# Patient Record
Sex: Male | Born: 1955 | Race: White | Hispanic: No | Marital: Married | Smoking: Former smoker
Health system: Southern US, Community
[De-identification: ages and names within clinical notes are randomized; demographics above are authoritative.]

## PROBLEM LIST (undated history)

## (undated) DIAGNOSIS — N529 Male erectile dysfunction, unspecified: Secondary | ICD-10-CM

## (undated) DIAGNOSIS — B059 Measles without complication: Secondary | ICD-10-CM

## (undated) DIAGNOSIS — K219 Gastro-esophageal reflux disease without esophagitis: Secondary | ICD-10-CM

## (undated) DIAGNOSIS — F329 Major depressive disorder, single episode, unspecified: Principal | ICD-10-CM

## (undated) DIAGNOSIS — B019 Varicella without complication: Secondary | ICD-10-CM

## (undated) DIAGNOSIS — B0089 Other herpesviral infection: Secondary | ICD-10-CM

## (undated) DIAGNOSIS — F418 Other specified anxiety disorders: Secondary | ICD-10-CM

## (undated) DIAGNOSIS — C44300 Unspecified malignant neoplasm of skin of unspecified part of face: Secondary | ICD-10-CM

## (undated) DIAGNOSIS — F419 Anxiety disorder, unspecified: Principal | ICD-10-CM

## (undated) DIAGNOSIS — H9190 Unspecified hearing loss, unspecified ear: Secondary | ICD-10-CM

## (undated) DIAGNOSIS — B351 Tinea unguium: Secondary | ICD-10-CM

## (undated) DIAGNOSIS — R030 Elevated blood-pressure reading, without diagnosis of hypertension: Secondary | ICD-10-CM

## (undated) DIAGNOSIS — E039 Hypothyroidism, unspecified: Secondary | ICD-10-CM

## (undated) DIAGNOSIS — E669 Obesity, unspecified: Secondary | ICD-10-CM

## (undated) DIAGNOSIS — B269 Mumps without complication: Secondary | ICD-10-CM

## (undated) DIAGNOSIS — Z0000 Encounter for general adult medical examination without abnormal findings: Secondary | ICD-10-CM

## (undated) DIAGNOSIS — N200 Calculus of kidney: Secondary | ICD-10-CM

## (undated) HISTORY — DX: Elevated blood-pressure reading, without diagnosis of hypertension: R03.0

## (undated) HISTORY — DX: Varicella without complication: B01.9

## (undated) HISTORY — DX: Calculus of kidney: N20.0

## (undated) HISTORY — DX: Male erectile dysfunction, unspecified: N52.9

## (undated) HISTORY — DX: Measles without complication: B05.9

## (undated) HISTORY — DX: Encounter for general adult medical examination without abnormal findings: Z00.00

## (undated) HISTORY — DX: Major depressive disorder, single episode, unspecified: F32.9

## (undated) HISTORY — DX: Anxiety disorder, unspecified: F41.9

## (undated) HISTORY — DX: Unspecified hearing loss, unspecified ear: H91.90

## (undated) HISTORY — DX: Unspecified malignant neoplasm of skin of unspecified part of face: C44.300

## (undated) HISTORY — DX: Obesity, unspecified: E66.9

## (undated) HISTORY — DX: Other herpesviral infection: B00.89

## (undated) HISTORY — DX: Other specified anxiety disorders: F41.8

## (undated) HISTORY — DX: Gastro-esophageal reflux disease without esophagitis: K21.9

## (undated) HISTORY — DX: Hypothyroidism, unspecified: E03.9

## (undated) HISTORY — DX: Tinea unguium: B35.1

## (undated) HISTORY — DX: Mumps without complication: B26.9

---

## 2006-05-03 ENCOUNTER — Ambulatory Visit: Payer: Self-pay | Admitting: Pulmonary Disease

## 2007-08-15 ENCOUNTER — Ambulatory Visit: Payer: Self-pay | Admitting: Pulmonary Disease

## 2007-08-15 LAB — CONVERTED CEMR LAB
AST: 21 units/L (ref 0–37)
Albumin: 4 g/dL (ref 3.5–5.2)
Basophils Absolute: 0 10*3/uL (ref 0.0–0.1)
Calcium: 8.9 mg/dL (ref 8.4–10.5)
Chloride: 107 meq/L (ref 96–112)
Direct LDL: 146.9 mg/dL
Eosinophils Absolute: 0.1 10*3/uL (ref 0.0–0.6)
GFR calc Af Amer: 101 mL/min
GFR calc non Af Amer: 83 mL/min
HDL: 35.5 mg/dL — ABNORMAL LOW (ref >39.0)
MCHC: 34.4 g/dL (ref 30.0–36.0)
MCV: 93.6 fL (ref 78.0–100.0)
Neutrophils Relative %: 54.7 % (ref 43.0–77.0)
Platelets: 187 10*3/uL (ref 150–400)
RBC: 4.64 M/uL (ref 4.22–5.81)
Sodium: 138 meq/L (ref 135–145)
Triglycerides: 136 mg/dL (ref 0–149)

## 2009-02-03 DIAGNOSIS — J209 Acute bronchitis, unspecified: Secondary | ICD-10-CM | POA: Insufficient documentation

## 2009-02-03 DIAGNOSIS — T7840XA Allergy, unspecified, initial encounter: Secondary | ICD-10-CM | POA: Insufficient documentation

## 2009-02-03 DIAGNOSIS — Z87898 Personal history of other specified conditions: Secondary | ICD-10-CM | POA: Insufficient documentation

## 2009-02-03 DIAGNOSIS — R0789 Other chest pain: Secondary | ICD-10-CM | POA: Insufficient documentation

## 2009-02-03 DIAGNOSIS — E7800 Pure hypercholesterolemia, unspecified: Secondary | ICD-10-CM | POA: Insufficient documentation

## 2009-02-03 DIAGNOSIS — M1990 Unspecified osteoarthritis, unspecified site: Secondary | ICD-10-CM | POA: Insufficient documentation

## 2009-08-19 ENCOUNTER — Encounter: Payer: Self-pay | Admitting: Adult Health

## 2009-08-19 ENCOUNTER — Ambulatory Visit: Payer: Self-pay | Admitting: Pulmonary Disease

## 2009-08-22 DIAGNOSIS — K219 Gastro-esophageal reflux disease without esophagitis: Secondary | ICD-10-CM | POA: Insufficient documentation

## 2009-08-23 LAB — CONVERTED CEMR LAB
ALT: 32 units/L (ref 0–53)
AST: 30 units/L (ref 0–37)
BUN: 13 mg/dL (ref 6–23)
Basophils Relative: 1.8 % (ref 0.0–3.0)
Chloride: 110 meq/L (ref 96–112)
Eosinophils Relative: 1.3 % (ref 0.0–5.0)
HCT: 44.4 % (ref 39.0–52.0)
Hemoglobin: 15.4 g/dL (ref 13.0–17.0)
Lymphs Abs: 1.8 10*3/uL (ref 0.7–4.0)
MCV: 93.6 fL (ref 78.0–100.0)
Monocytes Absolute: 0.4 10*3/uL (ref 0.1–1.0)
Neutro Abs: 2.5 10*3/uL (ref 1.4–7.7)
Platelets: 182 10*3/uL (ref 150.0–400.0)
Potassium: 3.9 meq/L (ref 3.5–5.1)
RBC: 4.74 M/uL (ref 4.22–5.81)
Sodium: 143 meq/L (ref 135–145)
TSH: 2.19 microintl units/mL (ref 0.35–5.50)
Total Protein: 7.3 g/dL (ref 6.0–8.3)
WBC: 4.9 10*3/uL (ref 4.5–10.5)

## 2009-09-09 ENCOUNTER — Ambulatory Visit: Payer: Self-pay | Admitting: Pulmonary Disease

## 2010-12-10 HISTORY — PX: WISDOM TOOTH EXTRACTION: SHX21

## 2012-10-03 ENCOUNTER — Encounter: Payer: Self-pay | Admitting: Family Medicine

## 2012-10-03 ENCOUNTER — Ambulatory Visit (INDEPENDENT_AMBULATORY_CARE_PROVIDER_SITE_OTHER): Payer: Managed Care, Other (non HMO) | Admitting: Family Medicine

## 2012-10-03 VITALS — BP 139/84 | HR 77 | Temp 97.4°F | Ht 75.0 in | Wt 261.1 lb

## 2012-10-03 DIAGNOSIS — E785 Hyperlipidemia, unspecified: Secondary | ICD-10-CM

## 2012-10-03 DIAGNOSIS — R030 Elevated blood-pressure reading, without diagnosis of hypertension: Secondary | ICD-10-CM | POA: Insufficient documentation

## 2012-10-03 DIAGNOSIS — E669 Obesity, unspecified: Secondary | ICD-10-CM

## 2012-10-03 DIAGNOSIS — T7840XA Allergy, unspecified, initial encounter: Secondary | ICD-10-CM

## 2012-10-03 DIAGNOSIS — E7800 Pure hypercholesterolemia, unspecified: Secondary | ICD-10-CM

## 2012-10-03 DIAGNOSIS — Z23 Encounter for immunization: Secondary | ICD-10-CM

## 2012-10-03 DIAGNOSIS — IMO0001 Reserved for inherently not codable concepts without codable children: Secondary | ICD-10-CM

## 2012-10-03 DIAGNOSIS — H60399 Other infective otitis externa, unspecified ear: Secondary | ICD-10-CM

## 2012-10-03 DIAGNOSIS — N400 Enlarged prostate without lower urinary tract symptoms: Secondary | ICD-10-CM

## 2012-10-03 DIAGNOSIS — H6090 Unspecified otitis externa, unspecified ear: Secondary | ICD-10-CM

## 2012-10-03 DIAGNOSIS — Z87898 Personal history of other specified conditions: Secondary | ICD-10-CM

## 2012-10-03 HISTORY — DX: Reserved for inherently not codable concepts without codable children: IMO0001

## 2012-10-03 HISTORY — DX: Obesity, unspecified: E66.9

## 2012-10-03 LAB — LIPID PANEL
Cholesterol: 215 mg/dL — ABNORMAL HIGH (ref 0–200)
HDL: 35.6 mg/dL — ABNORMAL LOW (ref >39.00)
VLDL: 37.6 mg/dL (ref 0.0–40.0)

## 2012-10-03 LAB — RENAL FUNCTION PANEL
Albumin: 3.7 g/dL (ref 3.5–5.2)
BUN: 14 mg/dL (ref 6–23)
Creatinine, Ser: 1.1 mg/dL (ref 0.4–1.5)
GFR: 73.73 mL/min (ref >60.00)
Phosphorus: 2.5 mg/dL (ref 2.3–4.6)

## 2012-10-03 LAB — HEPATIC FUNCTION PANEL
AST: 25 U/L (ref 0–37)
Albumin: 3.7 g/dL (ref 3.5–5.2)
Total Protein: 6.7 g/dL (ref 6.0–8.3)

## 2012-10-03 LAB — CBC
Platelets: 163 10*3/uL (ref 150.0–400.0)
RBC: 4.71 Mil/uL (ref 4.22–5.81)

## 2012-10-03 MED ORDER — TETANUS-DIPHTH-ACELL PERTUSSIS 5-2.5-18.5 LF-MCG/0.5 IM SUSP: 0.5000 mL | Freq: Once | INTRAMUSCULAR | Status: DC

## 2012-10-03 MED ORDER — NEOMYCIN-POLYMYXIN-HC 3.5-10000-1 OT SOLN: 3.0000 [drp] | Freq: Three times a day (TID) | OTIC | Status: DC

## 2012-10-03 NOTE — Patient Instructions (Addendum)
Otitis Externa Otitis externa is a bacterial or fungal infection of the outer ear canal. This is the area from the eardrum to the outside of the ear. Otitis externa is sometimes called "swimmer's ear." CAUSES  Possible causes of infection include:  Swimming in dirty water.  Moisture remaining in the ear after swimming or bathing.  Mild injury (trauma) to the ear.  Objects stuck in the ear (foreign body).  Cuts or scrapes (abrasions) on the outside of the ear. SYMPTOMS  The first symptom of infection is often itching in the ear canal. Later signs and symptoms may include swelling and redness of the ear canal, ear pain, and yellowish-white fluid (pus) coming from the ear. The ear pain may be worse when pulling on the earlobe. DIAGNOSIS  Your caregiver will perform a physical exam. A sample of fluid may be taken from the ear and examined for bacteria or fungi. TREATMENT  Antibiotic ear drops are often given for 10 to 14 days. Treatment may also include pain medicine or corticosteroids to reduce itching and swelling. PREVENTION   Keep your ear dry. Use the corner of a towel to absorb water out of the ear canal after swimming or bathing.  Avoid scratching or putting objects inside your ear. This can damage the ear canal or remove the protective wax that lines the canal. This makes it easier for bacteria and fungi to grow.  Avoid swimming in lakes, polluted water, or poorly chlorinated pools.  You may use ear drops made of rubbing alcohol and vinegar after swimming. Combine equal parts of white vinegar and alcohol in a bottle. Put 3 or 4 drops into each ear after swimming. HOME CARE INSTRUCTIONS   Apply antibiotic ear drops to the ear canal as prescribed by your caregiver.  Only take over-the-counter or prescription medicines for pain, discomfort, or fever as directed by your caregiver.  If you have diabetes, follow any additional treatment instructions from your caregiver.  Keep all  follow-up appointments as directed by your caregiver. SEEK MEDICAL CARE IF:   You have a fever.  Your ear is still red, swollen, painful, or draining pus after 3 days.  Your redness, swelling, or pain gets worse.  You have a severe headache.  You have redness, swelling, pain, or tenderness in the area behind your ear. MAKE SURE YOU:   Understand these instructions.  Will watch your condition.  Will get help right away if you are not doing well or get worse. Document Released: 11/26/2005 Document Revised: 02/18/2012 Document Reviewed: 12/13/2011 [employer] Patient Information 2013 [employer]  

## 2012-10-05 NOTE — Assessment & Plan Note (Signed)
Previously seen by urology but not in a couple of years. Will check PSA, asymptomatic

## 2012-10-05 NOTE — Assessment & Plan Note (Signed)
Given handout for DASH diet and check tsh

## 2012-10-05 NOTE — Assessment & Plan Note (Signed)
Patient agrees to fasting labs prior to next visit. will discuss at check up

## 2012-10-05 NOTE — Assessment & Plan Note (Signed)
Cortisporin otic is prescribed and encouraged probiotics daily

## 2012-10-05 NOTE — Assessment & Plan Note (Signed)
Encouraged daily antihistamine

## 2012-10-05 NOTE — Progress Notes (Signed)
Patient ID: Gerald Booth, male   DOB: 1956/08/05, 56 y.o.   MRN: [id number] Gerald Booth [id number] 1956-06-04 10/05/2012      Progress Note New Patient  Subjective  Chief Complaint  Chief Complaint  Patient presents with  . Otalgia    pt is establishing next week with Dr Abner Greenspan- both ears feel clogged, itchy, and achy X 1 week    HPI  Patient is a 56 year old Caucasian male who is in today for urgent new patient appointment. He's complaining of itching and aching in his ears for about a week. He does wear earplugs at work. He denies fevers or chills. Denies discharge or headache. Denies tinnitus. No chest pain, palpitations, shortness of breath, GI or GU complaints at this time. This penicillins allergy from tablet reports he taken amoxicillin without difficulty in the past.  Past Medical History  Diagnosis Date  . Elevated BP 10/03/2012  . Obese 10/03/2012    History reviewed. No pertinent past surgical history.  History reviewed. No pertinent family history.  History   Social History  . Marital Status: Married    Spouse Name: N/A    Number of Children: N/A  . Years of Education: N/A   Occupational History  . Not on file.   Social History Main Topics  . Smoking status: Former Smoker    Types: Cigarettes    Start date: 12/11/1999  . Smokeless tobacco: Not on file   Comment: smoked on weekends  . Alcohol Use: Yes     1 a day  . Drug Use: No  . Sexually Active: Not on file   Other Topics Concern  . Not on file   Social History Narrative  . No narrative on file    No current outpatient prescriptions on file prior to visit.   No current facility-administered medications on file prior to visit.    Allergies  Allergen Reactions  . Penicillins     Review of Systems  Review of Systems  Constitutional: Negative for fever, chills and malaise/fatigue.  HENT: Positive for ear discharge. Negative for hearing loss, ear pain, nosebleeds, congestion  and tinnitus.   Eyes: Negative for discharge.  Respiratory: Negative for cough, sputum production, shortness of breath and wheezing.   Cardiovascular: Negative for chest pain, palpitations and leg swelling.  Gastrointestinal: Negative for heartburn, nausea, vomiting, abdominal pain, diarrhea, constipation and blood in stool.  Genitourinary: Negative for dysuria, urgency, frequency and hematuria.  Musculoskeletal: Negative for myalgias, back pain and falls.  Skin: Negative for rash.  Neurological: Negative for dizziness, tremors, sensory change, focal weakness, loss of consciousness, weakness and headaches.  Endo/Heme/Allergies: Negative for polydipsia. Does not bruise/bleed easily.  Psychiatric/Behavioral: Negative for depression and suicidal ideas. The patient is not nervous/anxious and does not have insomnia.     Objective  BP 139/84  Pulse 77  Temp 97.4 F (36.3 C) (Temporal)  Ht 6\' 3"  (1.905 m)  Wt 261 lb 1.9 oz (118.443 kg)  BMI 32.64 kg/m2  SpO2 93%  Physical Exam  Physical Exam  Constitutional: He is oriented to person, place, and time and well-developed, well-nourished, and in no distress. No distress.  HENT:  Head: Normocephalic and atraumatic.  Left Ear: External ear normal.       Right ear canal erythematous and swollen  Eyes: Conjunctivae normal are normal.  Neck: Neck supple. No thyromegaly present.  Cardiovascular: Normal rate, regular rhythm and normal heart sounds.   No murmur heard. Pulmonary/Chest: Effort normal and breath sounds  normal. No respiratory distress.  Abdominal: He exhibits no distension and no mass. There is no tenderness.  Musculoskeletal: He exhibits no edema.  Neurological: He is alert and oriented to person, place, and time.  Skin: Skin is warm.  Psychiatric: Memory, affect and judgment normal.       Assessment & Plan  ALLERGY Encouraged daily antihistamine  Otitis externa Cortisporin otic is prescribed and encouraged probiotics  daily  Obese Given handout for DASH diet and check tsh  HYPERCHOLESTEROLEMIA Patient agrees to fasting labs prior to next visit. will discuss at check up  BENIGN PROSTATIC HYPERTROPHY, HX OF Previously seen by urology but not in a couple of years. Will check PSA, asymptomatic

## 2012-10-06 LAB — TSH: TSH: 3.19 u[IU]/mL (ref 0.35–5.50)

## 2012-10-06 LAB — LDL CHOLESTEROL, DIRECT: Direct LDL: 157.9 mg/dL

## 2012-10-10 ENCOUNTER — Encounter: Payer: Self-pay | Admitting: Family Medicine

## 2012-10-10 ENCOUNTER — Ambulatory Visit (INDEPENDENT_AMBULATORY_CARE_PROVIDER_SITE_OTHER): Payer: Managed Care, Other (non HMO) | Admitting: Family Medicine

## 2012-10-10 VITALS — BP 123/76 | HR 85 | Temp 98.4°F | Ht 75.0 in | Wt 253.1 lb

## 2012-10-10 DIAGNOSIS — H9190 Unspecified hearing loss, unspecified ear: Secondary | ICD-10-CM

## 2012-10-10 DIAGNOSIS — B009 Herpesviral infection, unspecified: Secondary | ICD-10-CM

## 2012-10-10 DIAGNOSIS — B351 Tinea unguium: Secondary | ICD-10-CM

## 2012-10-10 DIAGNOSIS — Z0000 Encounter for general adult medical examination without abnormal findings: Secondary | ICD-10-CM

## 2012-10-10 DIAGNOSIS — IMO0001 Reserved for inherently not codable concepts without codable children: Secondary | ICD-10-CM

## 2012-10-10 DIAGNOSIS — R030 Elevated blood-pressure reading, without diagnosis of hypertension: Secondary | ICD-10-CM

## 2012-10-10 DIAGNOSIS — E7800 Pure hypercholesterolemia, unspecified: Secondary | ICD-10-CM

## 2012-10-10 DIAGNOSIS — N529 Male erectile dysfunction, unspecified: Secondary | ICD-10-CM

## 2012-10-10 DIAGNOSIS — N200 Calculus of kidney: Secondary | ICD-10-CM

## 2012-10-10 DIAGNOSIS — M1990 Unspecified osteoarthritis, unspecified site: Secondary | ICD-10-CM

## 2012-10-10 DIAGNOSIS — E669 Obesity, unspecified: Secondary | ICD-10-CM

## 2012-10-10 DIAGNOSIS — B0089 Other herpesviral infection: Secondary | ICD-10-CM

## 2012-10-10 DIAGNOSIS — H60399 Other infective otitis externa, unspecified ear: Secondary | ICD-10-CM

## 2012-10-10 DIAGNOSIS — H6090 Unspecified otitis externa, unspecified ear: Secondary | ICD-10-CM

## 2012-10-10 DIAGNOSIS — K219 Gastro-esophageal reflux disease without esophagitis: Secondary | ICD-10-CM

## 2012-10-10 HISTORY — DX: Male erectile dysfunction, unspecified: N52.9

## 2012-10-10 HISTORY — DX: Other herpesviral infection: B00.89

## 2012-10-10 HISTORY — DX: Calculus of kidney: N20.0

## 2012-10-10 HISTORY — DX: Unspecified hearing loss, unspecified ear: H91.90

## 2012-10-10 HISTORY — DX: Encounter for general adult medical examination without abnormal findings: Z00.00

## 2012-10-10 HISTORY — DX: Tinea unguium: B35.1

## 2012-10-10 LAB — H. PYLORI ANTIBODY, IGG: H Pylori IgG: NEGATIVE

## 2012-10-10 MED ORDER — TADALAFIL 5 MG PO TABS: 5.0000 mg | ORAL_TABLET | Freq: Every day | ORAL | Status: DC | PRN

## 2012-10-10 MED ORDER — RANITIDINE HCL 300 MG PO TABS: 300.0000 mg | ORAL_TABLET | Freq: Every day | ORAL | Status: DC

## 2012-10-10 MED ORDER — VALACYCLOVIR HCL 500 MG PO TABS: 500.0000 mg | ORAL_TABLET | Freq: Two times a day (BID) | ORAL | Status: DC

## 2012-10-10 MED ORDER — ASPIRIN EC 81 MG PO TBEC: 81.0000 mg | DELAYED_RELEASE_TABLET | Freq: Every day | ORAL | Status: DC

## 2012-10-10 NOTE — Assessment & Plan Note (Signed)
Has tried Lamisil in the past. Continues to recur. We'll start distilled white vinegar soaks each night and then apply Vick's vapor rub and notify us if symptoms are not improving.

## 2012-10-10 NOTE — Assessment & Plan Note (Signed)
Patient has done a great job of following the DASH diet and moving more and his BP is much better. Continue to monitor sodium and we will reassess in 3 months or as needed

## 2012-10-10 NOTE — Assessment & Plan Note (Signed)
Has been recurrent over several years. He has some left upper quadrant discomfort as well as some burning such early and sour taste in his mouth with fluid at times. Will check a Helicobacter pylori titer and start him on ranitidine 300 mg daily. He is encouraged to continue attempting weight loss and to avoid spicy and fatty foods especially close to bedtime.

## 2012-10-10 NOTE — Assessment & Plan Note (Signed)
Avoid transplants continue weight loss attempts, increase exercise and start a fatty acid supplement.

## 2012-10-10 NOTE — Assessment & Plan Note (Signed)
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

## 2012-10-10 NOTE — Assessment & Plan Note (Signed)
Is up to date on most care except he continues to decline colonoscopy despite a 10+ minute discussion about the benefits vs the cost. Agrees to proceed with IFOB testing. Encouraged regular exercise and DASH diet

## 2012-10-10 NOTE — Assessment & Plan Note (Signed)
Has the most trouble with pain and stiffness in his fingers left hand is worse than right hand as well as his knees. He is encouraged to add Krill oil supplement and to maintain his activity level. He is encouraged to try Aspercreme when necessary and report if symptoms worsen.

## 2012-10-10 NOTE — Assessment & Plan Note (Signed)
Has used Levitra in the past with good results but would like to try the Cialis 5 mg daily. He previously had discussed this with his urologist per his urologist has left the area. He is given a prescription for the Cialis today. He is warned regarding side effects. Call if any concerns

## 2012-10-10 NOTE — Assessment & Plan Note (Signed)
Symptomatically improved does still have significant cerumen in left ear it is flushed today.

## 2012-10-10 NOTE — Assessment & Plan Note (Signed)
Good weight loss since his last visit encouraged him to continue the DASH diet

## 2012-10-10 NOTE — Assessment & Plan Note (Signed)
Encouraged good hydration with water and add lemon

## 2012-10-10 NOTE — Progress Notes (Signed)
Patient ID: Gerald Booth, male   DOB: [birth year], 56 y.o.   MRN: [id number] [name] [id number] 1956-03-04 10/10/2012      Progress Note New Patient  Subjective  Chief Complaint  Chief Complaint  Patient presents with  . Establish Care    new patient    HPI  Patient is a 56 year old Caucasian male who is in today to finish establish care. He was seen last week with otitis externa and is feeling much better. No more pain although there was a waiting period today and she feels relatively well. Does acknowledge she's had trouble off and on over the years of heartburn and has been flared a bit lately. He has some mild left upper quadrant discomfort as well as some substernal burning at night and occasionally some sour fluid in the mouth. He complains of stiffness and pain across his knuckles in both hands left worse than right as well as some bilateral knee pain at times. Onychomycosis has been treated in the past with Lamisil and has returned. Has a distant history kidney stones but has had not had one in years. Uses Levitra for ED but would like to switch to daily Cialis. Broke numerous ribs on the left falling downstairs years ago but has recovered from that nicely. No chest pain, palpitations, shortness of breath, GI or GU complaints otherwise noted  Past Medical History  Diagnosis Date  . Elevated BP 10/03/2012  . Obese 10/03/2012  . Chicken pox as a child  . Measles as a child  . Mumps as a child  . Kidney stones 10/10/2012  . Hearing loss 10/10/2012  . Onychomycosis 10/10/2012  . ED (erectile dysfunction) 10/10/2012    Past Surgical History  Procedure Date  . Wisdom tooth extraction 2012    1 tooth    Family History  Problem Relation Age of Onset  . Cancer Mother     breast, bone  . Hyperlipidemia Father   . Other Father     prostate surgery  . Heart attack Maternal Grandmother   . Kidney disease Maternal Grandfather     kidney failure    History    Social History  . Marital Status: Married    Spouse Name: N/A    Number of Children: N/A  . Years of Education: N/A   Occupational History  . Not on file.   Social History Main Topics  . Smoking status: Former Smoker    Types: Cigarettes    Start date: 12/11/1991  . Smokeless tobacco: Never Used   Comment: smoked on weekends  . Alcohol Use: Yes     1 a day  . Drug Use: No  . Sexually Active: Yes   Other Topics Concern  . Not on file   Social History Narrative  . No narrative on file    Current Outpatient Prescriptions on File Prior to Visit  Medication Sig Dispense Refill  . ibuprofen (ADVIL,MOTRIN) 200 MG tablet Take 200 mg by mouth every 6 (six) hours as needed.      . neomycin-polymyxin-hydrocortisone (CORTISPORIN) otic solution Place 3 drops into both ears 3 (three) times daily.  10 mL  0  . ranitidine (ZANTAC) 300 MG tablet Take 1 tablet (300 mg total) by mouth at bedtime.  30 tablet  5  . tadalafil (CIALIS) 5 MG tablet Take 1 tablet (5 mg total) by mouth daily as needed for erectile dysfunction.  30 tablet  5    Allergies  Allergen Reactions  .  Penicillins     Review of Systems  Review of Systems  Constitutional: Negative for fever, chills and malaise/fatigue.  HENT: Negative for hearing loss, nosebleeds and congestion.   Eyes: Negative for discharge.  Respiratory: Negative for cough, sputum production, shortness of breath and wheezing.   Cardiovascular: Negative for chest pain, palpitations and leg swelling.  Gastrointestinal: Positive for heartburn. Negative for nausea, vomiting, abdominal pain, diarrhea, constipation and blood in stool.  Genitourinary: Negative for dysuria, urgency, frequency and hematuria.  Musculoskeletal: Positive for back pain and joint pain. Negative for myalgias and falls.  Skin: Positive for itching and rash.  Neurological: Negative for dizziness, tremors, sensory change, focal weakness, loss of consciousness, weakness and  headaches.  Endo/Heme/Allergies: Negative for polydipsia. Does not bruise/bleed easily.  Psychiatric/Behavioral: Negative for depression and suicidal ideas. The patient is not nervous/anxious and does not have insomnia.     Objective  BP 123/76  Pulse 85  Temp 98.4 F (36.9 C) (Temporal)  Ht 6\' 3"  (1.905 m)  Wt 253 lb 1.9 oz (114.814 kg)  BMI 31.64 kg/m2  SpO2 94%  Physical Exam  Physical Exam  Constitutional: He is oriented to person, place, and time and well-developed, well-nourished, and in no distress. No distress.  HENT:  Head: Normocephalic and atraumatic.  Eyes: Conjunctivae normal are normal.  Neck: Neck supple. No thyromegaly present.  Cardiovascular: Normal rate, regular rhythm and normal heart sounds.   No murmur heard. Pulmonary/Chest: Effort normal and breath sounds normal. No respiratory distress.  Abdominal: Soft. Bowel sounds are normal. He exhibits no distension and no mass. There is no tenderness. There is no rebound and no guarding.  Musculoskeletal: He exhibits no edema.  Neurological: He is alert and oriented to person, place, and time.  Skin: Skin is warm and dry. No rash noted. No erythema.  Psychiatric: Memory, affect and judgment normal.       Assessment & Plan  Elevated BP Patient has done a great job of following the DASH diet and moving more and his BP is much better. Continue to monitor sodium and we will reassess in 3 months or as needed  GERD Has been recurrent over several years. He has some left upper quadrant discomfort as well as some burning such early and sour taste in his mouth with fluid at times. Will check a Helicobacter pylori titer and start him on ranitidine 300 mg daily. He is encouraged to continue attempting weight loss and to avoid spicy and fatty foods especially close to bedtime.  DEGENERATIVE JOINT DISEASE Has the most trouble with pain and stiffness in his fingers left hand is worse than right hand as well as his knees.  He is encouraged to add Krill oil supplement and to maintain his activity level. He is encouraged to try Aspercreme when necessary and report if symptoms worsen.  Otitis externa Symptomatically improved does still have significant cerumen in left ear it is flushed today.  Onychomycosis Has tried Lamisil in the past. Continues to recur. We'll start distilled white vinegar soaks each night and then apply Vick's vapor rub and notify us if symptoms are not improving.  Obese Good weight loss since his last visit encouraged him to continue the DASH diet  HYPERCHOLESTEROLEMIA  Avoid transplants continue weight loss attempts, increase exercise and start a fatty acid supplement.  Kidney stones Encouraged good hydration with water and add lemon  ED (erectile dysfunction) Has used Levitra in the past with good results but would like to try the Cialis  5 mg daily. He previously had discussed this with his urologist per his urologist has left the area. He is given a prescription for the Cialis today. He is warned regarding side effects. Call if any concerns

## 2012-10-10 NOTE — Patient Instructions (Addendum)
Try soaking feet in warm water and distilled white vinegar for 15 minutes each night and then apply Vick's Vapor Rub   Ringworm, Nail A fungal infection of the nail (tinea unguium/onychomycosis) is common. It is common as the visible part of the nail is composed of dead cells which have no blood supply to help prevent infection. It occurs because fungi are everywhere and will pick any opportunity to grow on any dead material. Because nails are very slow growing they require up to 2 years of treatment with anti-fungal medications. The entire nail back to the base is infected. This includes approximately  of the nail which you cannot see. If your caregiver has prescribed a medication by mouth, take it every day and as directed. No progress will be seen for at least 6 to 9 months. Do not be disappointed! Because fungi live on dead cells with little or no exposure to blood supply, medication delivery to the infection is slow; thus the cure is slow. It is also why you can observe no progress in the first 6 months. The nail becoming cured is the base of the nail, as it has the blood supply. Topical medication such as creams and ointments are usually not effective. Important in successful treatment of nail fungus is closely following the medication regimen that your doctor prescribes. Sometimes you and your caregiver may elect to speed up this process by surgical removal of all the nails. Even this may still require 6 to 9 months of additional oral medications. See your caregiver as directed. Remember there will be no visible improvement for at least 6 months. See your caregiver sooner if other signs of infection (redness and swelling) develop. Document Released: 11/23/2000 Document Revised: 02/18/2012 Document Reviewed: 02/01/2009 [facility] Patient Information 2013 [place], [place].

## 2012-10-13 ENCOUNTER — Other Ambulatory Visit: Payer: Self-pay

## 2012-10-13 MED ORDER — CELECOXIB 200 MG PO CAPS: 200.0000 mg | ORAL_CAPSULE | Freq: Two times a day (BID) | ORAL | Status: DC

## 2012-10-13 NOTE — Telephone Encounter (Signed)
pts wife informed and rx sent to pharmacy.   Pts wife states it was bid prn

## 2012-10-13 NOTE — Progress Notes (Signed)
Quick Note:  Patients spouse informed and voiced understanding ______ 

## 2012-10-13 NOTE — Telephone Encounter (Signed)
Celebrex might have been mentioned in passing, may have it just make sure he remembers he cannot take it with the Ibuprofen. I need to know if he took it once or twice a day. Celebrex 200 mg po bid is a standard dose, with food. Can send in that if he agrees with #60 1 rf

## 2012-10-13 NOTE — Telephone Encounter (Signed)
pts spouse called stating that pt thought Celebrex was going to be called into pharmacy for his arthritis. Please advise?

## 2012-10-14 ENCOUNTER — Other Ambulatory Visit: Payer: Managed Care, Other (non HMO)

## 2012-10-14 LAB — FECAL OCCULT BLOOD, IMMUNOCHEMICAL: Fecal Occult Bld: NEGATIVE

## 2012-10-15 NOTE — Progress Notes (Signed)
Quick Note:  Patient Informed and voiced understanding ______ 

## 2013-01-16 ENCOUNTER — Ambulatory Visit: Payer: Managed Care, Other (non HMO) | Admitting: Family Medicine

## 2013-01-27 ENCOUNTER — Ambulatory Visit (INDEPENDENT_AMBULATORY_CARE_PROVIDER_SITE_OTHER): Payer: Managed Care, Other (non HMO) | Admitting: Family Medicine

## 2013-01-27 ENCOUNTER — Telehealth: Payer: Self-pay | Admitting: Family Medicine

## 2013-01-27 ENCOUNTER — Encounter: Payer: Self-pay | Admitting: Family Medicine

## 2013-01-27 VITALS — BP 130/80 | HR 74 | Temp 97.5°F | Ht 75.0 in | Wt 260.1 lb

## 2013-01-27 DIAGNOSIS — F419 Anxiety disorder, unspecified: Secondary | ICD-10-CM

## 2013-01-27 DIAGNOSIS — R030 Elevated blood-pressure reading, without diagnosis of hypertension: Secondary | ICD-10-CM

## 2013-01-27 DIAGNOSIS — F418 Other specified anxiety disorders: Secondary | ICD-10-CM

## 2013-01-27 DIAGNOSIS — IMO0001 Reserved for inherently not codable concepts without codable children: Secondary | ICD-10-CM

## 2013-01-27 DIAGNOSIS — F32A Depression, unspecified: Secondary | ICD-10-CM | POA: Insufficient documentation

## 2013-01-27 DIAGNOSIS — F341 Dysthymic disorder: Secondary | ICD-10-CM

## 2013-01-27 HISTORY — DX: Anxiety disorder, unspecified: F41.9

## 2013-01-27 HISTORY — DX: Other specified anxiety disorders: F41.8

## 2013-01-27 HISTORY — DX: Depression, unspecified: F32.A

## 2013-01-27 MED ORDER — ESCITALOPRAM OXALATE 10 MG PO TABS
10.0000 mg | ORAL_TABLET | Freq: Every day | ORAL | Status: DC
Start: 2013-01-27 — End: 2013-04-10

## 2013-01-27 MED ORDER — ALPRAZOLAM 0.25 MG PO TABS: 0.2500 mg | ORAL_TABLET | Freq: Two times a day (BID) | ORAL | Status: DC | PRN

## 2013-01-27 NOTE — Telephone Encounter (Signed)
pts spouse states she will check with pt when he gets home

## 2013-01-27 NOTE — Assessment & Plan Note (Signed)
Slowly worsening over several months, acutely worse over 2 weeks since he found out his son will likely be moving to [place]. He will start Escitalopram 10 mg daily and given Alprazolam to use prn for anxiety and insomnia

## 2013-01-27 NOTE — Telephone Encounter (Signed)
Did you give this RX to patient? This is something that can't be electronically sent

## 2013-01-27 NOTE — Telephone Encounter (Signed)
I am 99% I did give it to him, it is likely mixed up in his check out papers, I remember handing it to him

## 2013-01-27 NOTE — Progress Notes (Signed)
Patient ID: Gerald Booth, male   DOB: 07-29-1956, 57 y.o.   MRN: [id number] Gerald Booth [id number] [birth year] 01/27/2013      Progress Note-Follow Up  Subjective  Chief Complaint  Chief Complaint  Patient presents with  . Insomnia    HPI  57 year old Caucasian male who is in today with worsening anxiety and depression. He reports his mood is been lower over several months. Roughly 2 recently finished a satisfactory moving to [place] and he notes she's been excessively irritable and easily upset since then. He reports he cried cc. Has trouble concentrating. Is having trouble sleeping. Is even describing periods of panic attack with difficulty breathing palpitations and a sense of crawling out of his skin. No other acute illness or triggers. He reports everything else at home is going well but worked continues to be a stressor.  Past Medical History  Diagnosis Date  . Elevated BP 10/03/2012  . Obese 10/03/2012  . Chicken pox as a child  . Measles as a child  . Mumps as a child  . Kidney stones 10/10/2012  . Hearing loss 10/10/2012  . Onychomycosis 10/10/2012  . ED (erectile dysfunction) 10/10/2012  . 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 Preventative health care 10/10/2012  . Anxiety and depression 01/27/2013  . Depression with anxiety 01/27/2013    Past Surgical History  Procedure Laterality Date  . Wisdom tooth extraction  2012    1 tooth    Family History  Problem Relation Age of Onset  . Cancer Mother     breast, bone  . Hyperlipidemia Father   . Other Father     prostate surgery  . Heart attack Maternal Grandmother   . Kidney disease Maternal Grandfather     kidney failure    History   Social History  . Marital Status: Married    Spouse Name: N/A    Number of Children: N/A  . Years of Education: N/A   Occupational History  . Not on file.   Social History Main Topics  . Smoking status: Former Smoker    Types: Cigarettes    Start date: 12/11/1991  .  Smokeless tobacco: Never Used     Comment: smoked on weekends  . Alcohol Use: Yes     Comment: 1 a day  . Drug Use: No  . Sexually Active: Yes   Other Topics Concern  . Not on file   Social History Narrative  . No narrative on file    Current Outpatient Prescriptions on File Prior to Visit  Medication Sig Dispense Refill  . aspirin EC 81 MG tablet Take 1 tablet (81 mg total) by mouth daily.      . celecoxib (CELEBREX) 200 MG capsule Take 1 capsule (200 mg total) by mouth 2 (two) times daily.  60 capsule  1  . ibuprofen (ADVIL,MOTRIN) 200 MG tablet Take 200 mg by mouth every 6 (six) hours as needed.      . neomycin-polymyxin-hydrocortisone (CORTISPORIN) otic solution Place 3 drops into both ears 3 (three) times daily.  10 mL  0  . ranitidine (ZANTAC) 300 MG tablet Take 1 tablet (300 mg total) by mouth at bedtime.  30 tablet  5  . tadalafil (CIALIS) 5 MG tablet Take 1 tablet (5 mg total) by mouth daily as needed for erectile dysfunction.  30 tablet  5  . valACYclovir (VALTREX) 500 MG tablet Take 1 tablet (500 mg total) by mouth 2 (two) times daily.  30 tablet  2  No current facility-administered medications on file prior to visit.    Allergies  Allergen Reactions  . Penicillins     Review of Systems  Review of Systems  Constitutional: Negative for fever and malaise/fatigue.  HENT: Negative for congestion.   Eyes: Negative for discharge.  Respiratory: Positive for shortness of breath.   Cardiovascular: Positive for palpitations. Negative for chest pain and leg swelling.  Gastrointestinal: Negative for nausea, abdominal pain and diarrhea.  Genitourinary: Negative for dysuria.  Musculoskeletal: Negative for falls.  Skin: Negative for rash.  Neurological: Negative for loss of consciousness and headaches.  Endo/Heme/Allergies: Negative for polydipsia.  Psychiatric/Behavioral: Positive for depression. Negative for suicidal ideas. The patient is nervous/anxious and has insomnia.      Objective  BP 130/80  Pulse 74  Temp(Src) 97.5 F (36.4 C) (Oral)  Ht 6\' 3"  (1.905 m)  Wt 260 lb 1.9 oz (117.99 kg)  BMI 32.51 kg/m2  SpO2 95%  Physical Exam  Physical Exam  Constitutional: He is oriented to person, place, and time and well-developed, well-nourished, and in no distress. No distress.  HENT:  Head: Normocephalic and atraumatic.  Eyes: Conjunctivae are normal.  Neck: Neck supple. No thyromegaly present.  Cardiovascular: Normal rate, regular rhythm and normal heart sounds.   No murmur heard. Pulmonary/Chest: Effort normal and breath sounds normal. No respiratory distress.  Abdominal: He exhibits no distension and no mass. There is no tenderness.  Musculoskeletal: He exhibits no edema.  Neurological: He is alert and oriented to person, place, and time.  Skin: Skin is warm.  Psychiatric: Memory, affect and judgment normal.    Lab Results  Component Value Date   TSH 3.19 10/03/2012   Lab Results  Component Value Date   WBC 4.8 10/03/2012   HGB 14.9 10/03/2012   HCT 44.7 10/03/2012   MCV 95.1 10/03/2012   PLT 163.0 10/03/2012   Lab Results  Component Value Date   CREATININE 1.1 10/03/2012   BUN 14 10/03/2012   NA 139 10/03/2012   K 4.1 10/03/2012   CL 105 10/03/2012   CO2 28 10/03/2012   Lab Results  Component Value Date   ALT 30 10/03/2012   AST 25 10/03/2012   ALKPHOS 61 10/03/2012   BILITOT 1.1 10/03/2012   Lab Results  Component Value Date   CHOL 215* 10/03/2012   Lab Results  Component Value Date   HDL 35.60* 10/03/2012   No results found for this basename: LDLCALC   Lab Results  Component Value Date   TRIG 188.0* 10/03/2012   Lab Results  Component Value Date   CHOLHDL 6 10/03/2012     Assessment & Plan  Elevated BP Well controlled, no changes to meds today  Depression with anxiety Slowly worsening over several months, acutely worse over 2 weeks since he found out his son will likely be moving to [place]. He will  start Escitalopram 10 mg daily and given Alprazolam to use prn for anxiety and insomnia

## 2013-01-27 NOTE — Patient Instructions (Addendum)

## 2013-01-27 NOTE — Telephone Encounter (Signed)
Patients wife called in stating that she called the pharmacy and the pharmacy told her that they never received alprazolam RX. Please resend

## 2013-01-27 NOTE — Assessment & Plan Note (Signed)
Well controlled, no changes to meds today

## 2013-03-09 ENCOUNTER — Telehealth: Payer: Self-pay | Admitting: Family Medicine

## 2013-03-09 NOTE — Telephone Encounter (Signed)
Yes he can take a 1/2 tab and take it at bedtime

## 2013-03-09 NOTE — Telephone Encounter (Signed)
Forwarded to provider for advisement/SLS

## 2013-03-09 NOTE — Telephone Encounter (Signed)
Patient is feeling very tired when taking the Lexapro. Can he take half the dosage to see if that helps? Okay to [facility] 03/10/13.

## 2013-03-10 NOTE — Telephone Encounter (Signed)
Patient unavailable; informed wife, Ardine Bjork HIPAA], of provider instructions; understood & agreed/SLS

## 2013-04-09 ENCOUNTER — Ambulatory Visit: Payer: Managed Care, Other (non HMO) | Admitting: Family Medicine

## 2013-04-10 ENCOUNTER — Ambulatory Visit (INDEPENDENT_AMBULATORY_CARE_PROVIDER_SITE_OTHER): Payer: Managed Care, Other (non HMO) | Admitting: Family Medicine

## 2013-04-10 ENCOUNTER — Encounter: Payer: Self-pay | Admitting: Family Medicine

## 2013-04-10 VITALS — BP 124/78 | HR 76 | Temp 97.9°F | Ht 75.0 in | Wt 258.0 lb

## 2013-04-10 DIAGNOSIS — E7800 Pure hypercholesterolemia, unspecified: Secondary | ICD-10-CM

## 2013-04-10 DIAGNOSIS — M1990 Unspecified osteoarthritis, unspecified site: Secondary | ICD-10-CM

## 2013-04-10 DIAGNOSIS — F341 Dysthymic disorder: Secondary | ICD-10-CM

## 2013-04-10 DIAGNOSIS — K219 Gastro-esophageal reflux disease without esophagitis: Secondary | ICD-10-CM

## 2013-04-10 DIAGNOSIS — F32A Depression, unspecified: Secondary | ICD-10-CM

## 2013-04-10 DIAGNOSIS — N529 Male erectile dysfunction, unspecified: Secondary | ICD-10-CM

## 2013-04-10 DIAGNOSIS — F419 Anxiety disorder, unspecified: Secondary | ICD-10-CM

## 2013-04-10 DIAGNOSIS — F411 Generalized anxiety disorder: Secondary | ICD-10-CM

## 2013-04-10 DIAGNOSIS — E669 Obesity, unspecified: Secondary | ICD-10-CM

## 2013-04-10 MED ORDER — ALPRAZOLAM 0.25 MG PO TABS: 0.2500 mg | ORAL_TABLET | Freq: Two times a day (BID) | ORAL | Status: DC | PRN

## 2013-04-10 MED ORDER — ESCITALOPRAM OXALATE 5 MG PO TABS: 5.0000 mg | ORAL_TABLET | Freq: Every day | ORAL | Status: DC

## 2013-04-10 NOTE — Patient Instructions (Addendum)
Consider Krill oil/MegaRed caps daily goes on sale 2 for 1 at [employer] with audiology results Next visit annual with labs prior lipid, renal, hepatic, cbc, hgba1c Cholesterol Cholesterol is a white, waxy, fat-like protein needed by your body in small amounts. The liver makes all the cholesterol you need. It is carried from the liver by the blood through the blood vessels. Deposits (plaque) may build up on blood vessel walls. This makes the arteries narrower and stiffer. Plaque increases the risk for heart attack and stroke. You cannot feel your cholesterol level even if it is very high. The only way to know is by a blood test to check your lipid (fats) levels. Once you know your cholesterol levels, you should keep a record of the test results. Work with your caregiver to to keep your levels in the desired range. WHAT THE RESULTS MEAN:  Total cholesterol is a rough measure of all the cholesterol in your blood.  LDL is the so-called bad cholesterol. This is the type that deposits cholesterol in the walls of the arteries. You want this level to be low.  HDL is the good cholesterol because it cleans the arteries and carries the LDL away. You want this level to be high.  Triglycerides are fat that the body can either burn for energy or store. High levels are closely linked to heart disease. DESIRED LEVELS:  Total cholesterol below 200.  LDL below 100 for people at risk, below 70 for very high risk.  HDL above 50 is good, above 60 is best.  Triglycerides below 150. HOW TO LOWER YOUR CHOLESTEROL:  Diet.  Choose fish or white meat chicken and [place], roasted or baked. Limit fatty cuts of red meat, fried foods, and processed meats, such as sausage and lunch meat.  Eat lots of fresh fruits and vegetables. Choose whole grains, beans, pasta, potatoes and cereals.  Use only small amounts of olive, corn or canola oils. Avoid butter, mayonnaise, shortening or palm kernel oils.  Avoid foods with trans-fats.  Use skim/nonfat milk and low-fat/nonfat yogurt and cheeses. Avoid whole milk, cream, ice cream, egg yolks and cheeses. Healthy desserts include angel food cake, ginger snaps, animal crackers, hard candy, popsicles, and low-fat/nonfat frozen yogurt. Avoid pastries, cakes, pies and cookies.  Exercise.  A regular program helps decrease LDL and raises HDL.  Helps with weight control.  Do things that increase your activity level like gardening, walking, or taking the stairs.  Medication.  May be prescribed by your caregiver to help lowering cholesterol and the risk for heart disease.  You may need medicine even if your levels are normal if you have several risk factors. HOME CARE INSTRUCTIONS   Follow your diet and exercise programs as suggested by your caregiver.  Take medications as directed.  Have blood work done when your caregiver feels it is necessary. MAKE SURE YOU:   Understand these instructions.  Will watch your condition.  Will get help right away if you are not doing well or get worse. Document Released: 08/21/2001 Document Revised: 02/18/2012 Document Reviewed: 02/11/2008 [facility] Patient Information 2013 [place], [place].

## 2013-04-12 NOTE — Progress Notes (Signed)
Patient ID: Gerald Booth, male   DOB: 08/17/1956, 57 y.o.   MRN: [id number] Gerald Booth [id number] 06-09-1956 04/12/2013      Progress Note-Follow Up  Subjective  Chief Complaint  Chief Complaint  Patient presents with  . Follow-up    HPI  Patient is a 57 year old Caucasian male who is in today for followup. Overall doing well. It's generally good relief from Celebrex to once daily occasionally has to take a second dose on a bad day. Denies any recent illness. No fevers or chills. No chest pain, headaches, palpitations, shortness of breath, GI or GU concerns. Uses Zantac intermittently for reflux but no acute complaints at this visit.  Past Medical History  Diagnosis Date  . Elevated BP 10/03/2012  . Obese 10/03/2012  . Chicken pox as a child  . Measles as a child  . Mumps as a child  . Kidney stones 10/10/2012  . Hearing loss 10/10/2012  . Onychomycosis 10/10/2012  . ED (erectile dysfunction) 10/10/2012  . 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 Preventative health care 10/10/2012  . Anxiety and depression 01/27/2013  . Depression with anxiety 01/27/2013    Past Surgical History  Procedure Laterality Date  . Wisdom tooth extraction  2012    1 tooth    Family History  Problem Relation Age of Onset  . Cancer Mother     breast, bone  . Hyperlipidemia Father   . Other Father     prostate surgery  . Heart attack Maternal Grandmother   . Kidney disease Maternal Grandfather     kidney failure    History   Social History  . Marital Status: Married    Spouse Name: N/A    Number of Children: N/A  . Years of Education: N/A   Occupational History  . Not on file.   Social History Main Topics  . Smoking status: Former Smoker    Types: Cigarettes    Start date: 12/11/1991  . Smokeless tobacco: Never Used     Comment: smoked on weekends  . Alcohol Use: Yes     Comment: 1 a day  . Drug Use: No  . Sexually Active: Yes   Other Topics Concern  . Not on file    Social History Narrative  . No narrative on file    Current Outpatient Prescriptions on File Prior to Visit  Medication Sig Dispense Refill  . aspirin EC 81 MG tablet Take 1 tablet (81 mg total) by mouth daily.      . celecoxib (CELEBREX) 200 MG capsule Take 1 capsule (200 mg total) by mouth 2 (two) times daily.  60 capsule  1  . neomycin-polymyxin-hydrocortisone (CORTISPORIN) otic solution Place 3 drops into both ears 3 (three) times daily.  10 mL  0  . ranitidine (ZANTAC) 300 MG tablet Take 1 tablet (300 mg total) by mouth at bedtime.  30 tablet  5  . tadalafil (CIALIS) 5 MG tablet Take 1 tablet (5 mg total) by mouth daily as needed for erectile dysfunction.  30 tablet  5  . valACYclovir (VALTREX) 500 MG tablet Take 1 tablet (500 mg total) by mouth 2 (two) times daily.  30 tablet  2   No current facility-administered medications on file prior to visit.    Allergies  Allergen Reactions  . Penicillins     Review of Systems  Review of Systems  Constitutional: Negative for fever and malaise/fatigue.  HENT: Negative for congestion.   Eyes: Negative for discharge.  Respiratory:  Negative for shortness of breath.   Cardiovascular: Negative for chest pain, palpitations and leg swelling.  Gastrointestinal: Negative for nausea, abdominal pain and diarrhea.  Genitourinary: Negative for dysuria.  Musculoskeletal: Positive for back pain and joint pain. Negative for falls.  Skin: Negative for rash.  Neurological: Negative for loss of consciousness and headaches.  Endo/Heme/Allergies: Negative for polydipsia.  Psychiatric/Behavioral: Negative for depression and suicidal ideas. The patient is not nervous/anxious and does not have insomnia.     Objective  BP 124/78  Pulse 76  Temp(Src) 97.9 F (36.6 C) (Oral)  Ht 6\' 3"  (1.905 m)  Wt 258 lb 0.6 oz (117.046 kg)  BMI 32.25 kg/m2  SpO2 94%  Physical Exam  Physical Exam  Constitutional: He is oriented to person, place, and time and  well-developed, well-nourished, and in no distress. No distress.  HENT:  Head: Normocephalic and atraumatic.  Eyes: Conjunctivae are normal.  Neck: Neck supple. No thyromegaly present.  Cardiovascular: Normal rate, regular rhythm and normal heart sounds.   No murmur heard. Pulmonary/Chest: Effort normal and breath sounds normal. No respiratory distress.  Abdominal: He exhibits no distension and no mass. There is no tenderness.  Musculoskeletal: He exhibits no edema.  Neurological: He is alert and oriented to person, place, and time.  Skin: Skin is warm.  Psychiatric: Memory, affect and judgment normal.    Lab Results  Component Value Date   TSH 3.19 10/03/2012   Lab Results  Component Value Date   WBC 4.8 10/03/2012   HGB 14.9 10/03/2012   HCT 44.7 10/03/2012   MCV 95.1 10/03/2012   PLT 163.0 10/03/2012   Lab Results  Component Value Date   CREATININE 1.1 10/03/2012   BUN 14 10/03/2012   NA 139 10/03/2012   K 4.1 10/03/2012   CL 105 10/03/2012   CO2 28 10/03/2012   Lab Results  Component Value Date   ALT 30 10/03/2012   AST 25 10/03/2012   ALKPHOS 61 10/03/2012   BILITOT 1.1 10/03/2012   Lab Results  Component Value Date   CHOL 215* 10/03/2012   Lab Results  Component Value Date   HDL 35.60* 10/03/2012   No results found for this basename: [facility]   Lab Results  Component Value Date   TRIG 188.0* 10/03/2012   Lab Results  Component Value Date   CHOLHDL 6 10/03/2012     Assessment & Plan  HYPERCHOLESTEROLEMIA Mild, avoid trans fats, minimize saturated fats and simple carbs. Increase exercise and consider Krill oil capsule daily.  Obese Encouraged DASH diet and increase exercise  Anxiety and depression Well-managed on Lexapro and occasional alprazolam.  ED (erectile dysfunction) Cialis 5 mg  GERD No complaints today. Good response to ranitidine as needed.  DEGENERATIVE JOINT DISEASE Uses Celebrex with good effect once to twice daily.  Should not use other NSAIDs with this medication but instead use Tylenol for breakthrough pain.

## 2013-04-12 NOTE — Assessment & Plan Note (Signed)
Cialis 5 mg 

## 2013-04-12 NOTE — Assessment & Plan Note (Signed)
Encouraged DASH diet and increase exercise

## 2013-04-12 NOTE — Assessment & Plan Note (Signed)
Mild, avoid trans fats, minimize saturated fats and simple carbs. Increase exercise and consider Krill oil capsule daily.

## 2013-04-12 NOTE — Assessment & Plan Note (Signed)
No complaints today. Good response to ranitidine as needed.

## 2013-04-12 NOTE — Assessment & Plan Note (Signed)
Well-managed on Lexapro and occasional alprazolam.

## 2013-04-12 NOTE — Assessment & Plan Note (Addendum)
Uses Celebrex with good effect once to twice daily. Should not use other NSAIDs with this medication but instead use Tylenol for breakthrough pain.

## 2013-09-25 ENCOUNTER — Telehealth: Payer: Self-pay | Admitting: *Deleted

## 2013-09-25 ENCOUNTER — Ambulatory Visit (INDEPENDENT_AMBULATORY_CARE_PROVIDER_SITE_OTHER): Payer: Managed Care, Other (non HMO)

## 2013-09-25 DIAGNOSIS — F32A Depression, unspecified: Secondary | ICD-10-CM

## 2013-09-25 DIAGNOSIS — F329 Major depressive disorder, single episode, unspecified: Secondary | ICD-10-CM

## 2013-09-25 DIAGNOSIS — Z23 Encounter for immunization: Secondary | ICD-10-CM

## 2013-09-25 MED ORDER — ALPRAZOLAM 0.25 MG PO TABS: 0.2500 mg | ORAL_TABLET | Freq: Two times a day (BID) | ORAL | Status: DC | PRN

## 2013-09-25 NOTE — Telephone Encounter (Signed)
Rx called to pharmacy voicemail. 

## 2013-09-25 NOTE — Telephone Encounter (Signed)
eScribe request for refill on Alprazolam Last filled - 05.02.14, #30x1 Last AEX - 05.02.14 Next AEX - 6 Months  Please Advise/SLS

## 2013-09-25 NOTE — Telephone Encounter (Signed)
OK to give refill on Alprazolam same strength, same sig, #30 with 1 rf

## 2013-09-28 ENCOUNTER — Other Ambulatory Visit: Payer: Self-pay | Admitting: Family Medicine

## 2013-09-28 NOTE — Telephone Encounter (Signed)
RX faxed and left a message for patient to return my call

## 2013-09-28 NOTE — Telephone Encounter (Signed)
Rx last filled 09/25/13 #30x 1 rf.  Pt last seen 04/10/13. Pls advise.

## 2013-09-28 NOTE — Telephone Encounter (Signed)
Approved refill needs appt for further refills, due to controlled substance, recurring prescriptions, needs to be seen at least every 6 months

## 2013-12-04 ENCOUNTER — Telehealth: Payer: Self-pay | Admitting: Family Medicine

## 2013-12-04 ENCOUNTER — Ambulatory Visit (INDEPENDENT_AMBULATORY_CARE_PROVIDER_SITE_OTHER): Payer: Managed Care, Other (non HMO) | Admitting: Family Medicine

## 2013-12-04 ENCOUNTER — Encounter: Payer: Self-pay | Admitting: Family Medicine

## 2013-12-04 VITALS — BP 120/90 | HR 73 | Temp 97.6°F | Ht 75.0 in | Wt 257.0 lb

## 2013-12-04 DIAGNOSIS — Z0000 Encounter for general adult medical examination without abnormal findings: Secondary | ICD-10-CM

## 2013-12-04 DIAGNOSIS — Z23 Encounter for immunization: Secondary | ICD-10-CM

## 2013-12-04 DIAGNOSIS — IMO0001 Reserved for inherently not codable concepts without codable children: Secondary | ICD-10-CM

## 2013-12-04 DIAGNOSIS — E7800 Pure hypercholesterolemia, unspecified: Secondary | ICD-10-CM

## 2013-12-04 DIAGNOSIS — B009 Herpesviral infection, unspecified: Secondary | ICD-10-CM

## 2013-12-04 DIAGNOSIS — F341 Dysthymic disorder: Secondary | ICD-10-CM

## 2013-12-04 DIAGNOSIS — E785 Hyperlipidemia, unspecified: Secondary | ICD-10-CM

## 2013-12-04 DIAGNOSIS — E669 Obesity, unspecified: Secondary | ICD-10-CM

## 2013-12-04 DIAGNOSIS — R030 Elevated blood-pressure reading, without diagnosis of hypertension: Secondary | ICD-10-CM

## 2013-12-04 DIAGNOSIS — N529 Male erectile dysfunction, unspecified: Secondary | ICD-10-CM

## 2013-12-04 DIAGNOSIS — R7309 Other abnormal glucose: Secondary | ICD-10-CM

## 2013-12-04 DIAGNOSIS — F32A Depression, unspecified: Secondary | ICD-10-CM

## 2013-12-04 DIAGNOSIS — F329 Major depressive disorder, single episode, unspecified: Secondary | ICD-10-CM

## 2013-12-04 DIAGNOSIS — R739 Hyperglycemia, unspecified: Secondary | ICD-10-CM

## 2013-12-04 LAB — HEMOGLOBIN A1C
Hgb A1c MFr Bld: 5.4 % (ref <5.7)
Mean Plasma Glucose: 108 mg/dL (ref <117)

## 2013-12-04 LAB — CBC
HCT: 44.5 % (ref 39.0–52.0)
MCH: 32.1 pg (ref 26.0–34.0)
MCHC: 36.2 g/dL — ABNORMAL HIGH (ref 30.0–36.0)
Platelets: 164 10*3/uL (ref 150–400)
RDW: 13.1 % (ref 11.5–15.5)

## 2013-12-04 LAB — RENAL FUNCTION PANEL
Albumin: 4.5 g/dL (ref 3.5–5.2)
Calcium: 9.4 mg/dL (ref 8.4–10.5)
Sodium: 138 mEq/L (ref 135–145)

## 2013-12-04 LAB — HEPATIC FUNCTION PANEL
AST: 22 U/L (ref 0–37)
Albumin: 4.5 g/dL (ref 3.5–5.2)
Alkaline Phosphatase: 72 U/L (ref 39–117)
Indirect Bilirubin: 0.8 mg/dL (ref 0.0–0.9)
Total Protein: 7 g/dL (ref 6.0–8.3)

## 2013-12-04 LAB — LIPID PANEL
HDL: 43 mg/dL (ref >39)
LDL Cholesterol: 143 mg/dL — ABNORMAL HIGH (ref 0–99)
Total CHOL/HDL Ratio: 5.6 Ratio
VLDL: 54 mg/dL — ABNORMAL HIGH (ref 0–40)

## 2013-12-04 MED ORDER — ACYCLOVIR 800 MG PO TABS: 800.0000 mg | ORAL_TABLET | Freq: Three times a day (TID) | ORAL | Status: DC | PRN

## 2013-12-04 MED ORDER — TADALAFIL 5 MG PO TABS
5.0000 mg | ORAL_TABLET | Freq: Every day | ORAL | Status: DC | PRN
Start: 2013-12-04 — End: 2014-06-18

## 2013-12-04 MED ORDER — ALPRAZOLAM 0.25 MG PO TABS: 0.2500 mg | ORAL_TABLET | Freq: Every day | ORAL | Status: DC | PRN

## 2013-12-04 NOTE — Patient Instructions (Signed)

## 2013-12-04 NOTE — Telephone Encounter (Signed)
LAB ORDER WEEK OF 05-25-2014 Annual exam labs prior to visit lipid, renal, cbc, tsh, hepatic, hgba1c

## 2013-12-04 NOTE — Progress Notes (Signed)
Pre visit review using our clinic review tool, if applicable. No additional management support is needed unless otherwise documented below in the visit note. 

## 2013-12-06 ENCOUNTER — Encounter: Payer: Self-pay | Admitting: Family Medicine

## 2013-12-06 NOTE — Assessment & Plan Note (Signed)
Well controlled, no changes, encouraged DASH diet

## 2013-12-06 NOTE — Progress Notes (Signed)
Patient ID: Gerald Booth, male   DOB: 12/11/1955, 56 y.o.   MRN: [id number] Lamarion Mcevers [id number] [birth year] 12/06/2013      Progress Note-Follow Up  Subjective  Chief Complaint  Chief Complaint  Patient presents with  . Follow-up  . Injections    prevnar    HPI  Patient is a 57 year old male who is in today for followup. Generally been feeling well. No recent illness. Denies chest pain, palpitations or shortness of breath. No GI or GU concerns. Taking medications as prescribed. Given Prevnar today  Past Medical History  Diagnosis Date  . Elevated BP 10/03/2012  . Obese 10/03/2012  . Chicken pox as a child  . Measles as a child  . Mumps as a child  . Kidney stones 10/10/2012  . Hearing loss 10/10/2012  . Onychomycosis 10/10/2012  . ED (erectile dysfunction) 10/10/2012  . 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 Preventative health care 10/10/2012  . Anxiety and depression 01/27/2013  . Depression with anxiety 01/27/2013    Past Surgical History  Procedure Laterality Date  . Wisdom tooth extraction  2012    1 tooth    Family History  Problem Relation Age of Onset  . Cancer Mother     breast, bone  . Hyperlipidemia Father   . Other Father     prostate surgery  . Heart attack Maternal Grandmother   . Kidney disease Maternal Grandfather     kidney failure    History   Social History  . Marital Status: Married    Spouse Name: N/A    Number of Children: N/A  . Years of Education: N/A   Occupational History  . Not on file.   Social History Main Topics  . Smoking status: Former Smoker    Types: Cigarettes    Start date: 12/11/1991  . Smokeless tobacco: Never Used     Comment: smoked on weekends  . Alcohol Use: Yes     Comment: 1 a day  . Drug Use: No  . Sexual Activity: Yes   Other Topics Concern  . Not on file   Social History Narrative  . No narrative on file    Current Outpatient Prescriptions on File Prior to Visit  Medication Sig Dispense  Refill  . aspirin EC 81 MG tablet Take 1 tablet (81 mg total) by mouth daily.      . celecoxib (CELEBREX) 200 MG capsule Take 1 capsule (200 mg total) by mouth 2 (two) times daily.  60 capsule  1  . escitalopram (LEXAPRO) 5 MG tablet Take 1 tablet (5 mg total) by mouth daily.  30 tablet  5  . neomycin-polymyxin-hydrocortisone (CORTISPORIN) otic solution Place 3 drops into both ears 3 (three) times daily.  10 mL  0  . ranitidine (ZANTAC) 300 MG tablet Take 1 tablet (300 mg total) by mouth at bedtime.  30 tablet  5   No current facility-administered medications on file prior to visit.    Allergies  Allergen Reactions  . Penicillins     Review of Systems  Review of Systems  Constitutional: Negative for fever and malaise/fatigue.  HENT: Negative for congestion.   Eyes: Negative for discharge.  Respiratory: Negative for shortness of breath.   Cardiovascular: Negative for chest pain, palpitations and leg swelling.  Gastrointestinal: Negative for nausea, abdominal pain and diarrhea.  Genitourinary: Negative for dysuria.  Musculoskeletal: Negative for falls.  Skin: Negative for rash.  Neurological: Negative for loss of consciousness and headaches.  Endo/Heme/Allergies:  Negative for polydipsia.  Psychiatric/Behavioral: Negative for depression and suicidal ideas. The patient is not nervous/anxious and does not have insomnia.     Objective  BP 120/90  Pulse 73  Temp(Src) 97.6 F (36.4 C) (Oral)  Ht 6\' 3"  (1.905 m)  Wt 257 lb (116.574 kg)  BMI 32.12 kg/m2  SpO2 94%  Physical Exam  Physical Exam  Constitutional: He is oriented to person, place, and time and well-developed, well-nourished, and in no distress. No distress.  HENT:  Head: Normocephalic and atraumatic.  Eyes: Conjunctivae are normal.  Neck: Neck supple. No thyromegaly present.  Cardiovascular: Normal rate, regular rhythm and normal heart sounds.   No murmur heard. Pulmonary/Chest: Effort normal and breath sounds  normal. No respiratory distress.  Abdominal: He exhibits no distension and no mass. There is no tenderness.  Musculoskeletal: He exhibits no edema.  Neurological: He is alert and oriented to person, place, and time.  Skin: Skin is warm.  Psychiatric: Memory, affect and judgment normal.    Lab Results  Component Value Date   TSH 3.19 10/03/2012   Lab Results  Component Value Date   WBC 6.0 12/04/2013   HGB 16.1 12/04/2013   HCT 44.5 12/04/2013   MCV 88.6 12/04/2013   PLT 164 12/04/2013   Lab Results  Component Value Date   CREATININE 1.05 12/04/2013   BUN 13 12/04/2013   NA 138 12/04/2013   K 4.2 12/04/2013   CL 103 12/04/2013   CO2 26 12/04/2013   Lab Results  Component Value Date   ALT 28 12/04/2013   AST 22 12/04/2013   ALKPHOS 72 12/04/2013   BILITOT 0.9 12/04/2013   Lab Results  Component Value Date   CHOL 240* 12/04/2013   Lab Results  Component Value Date   HDL 43 12/04/2013   Lab Results  Component Value Date   LDLCALC 143* 12/04/2013   Lab Results  Component Value Date   TRIG 270* 12/04/2013   Lab Results  Component Value Date   CHOLHDL 5.6 12/04/2013     Assessment & Plan  Elevated BP Well controlled, no changes, encouraged DASH diet  HYPERCHOLESTEROLEMIA avoid trans fats. Increase activity as tolerated. Add krill oil caps daily. neds to consider statins if no improvement.   Obese Avoid trans fats, try DASH diet. Increase exercise

## 2013-12-06 NOTE — Assessment & Plan Note (Signed)
Avoid trans fats, try DASH diet. Increase exercise

## 2013-12-06 NOTE — Assessment & Plan Note (Signed)
avoid trans fats. Increase activity as tolerated. Add krill oil caps daily. neds to consider statins if no improvement.

## 2014-01-28 ENCOUNTER — Encounter: Payer: Managed Care, Other (non HMO) | Admitting: Family Medicine

## 2014-02-23 ENCOUNTER — Telehealth: Payer: Self-pay | Admitting: Family Medicine

## 2014-02-23 DIAGNOSIS — F411 Generalized anxiety disorder: Secondary | ICD-10-CM

## 2014-02-23 MED ORDER — ESCITALOPRAM OXALATE 5 MG PO TABS: 5.0000 mg | ORAL_TABLET | Freq: Every day | ORAL | Status: DC

## 2014-02-23 NOTE — Telephone Encounter (Signed)
90 day supply  escitalopram

## 2014-04-15 ENCOUNTER — Encounter: Payer: Self-pay | Admitting: Physician Assistant

## 2014-04-15 ENCOUNTER — Ambulatory Visit (INDEPENDENT_AMBULATORY_CARE_PROVIDER_SITE_OTHER): Payer: Managed Care, Other (non HMO) | Admitting: Physician Assistant

## 2014-04-15 ENCOUNTER — Telehealth: Payer: Self-pay | Admitting: Family Medicine

## 2014-04-15 VITALS — BP 122/82 | HR 80 | Temp 97.8°F | Wt 251.1 lb

## 2014-04-15 DIAGNOSIS — M26609 Unspecified temporomandibular joint disorder, unspecified side: Secondary | ICD-10-CM

## 2014-04-15 MED ORDER — ANTIPYRINE-BENZOCAINE 5.4-1.4 % OT SOLN: 3.0000 [drp] | OTIC | Status: DC | PRN

## 2014-04-15 MED ORDER — CELECOXIB 200 MG PO CAPS: 200.0000 mg | ORAL_CAPSULE | Freq: Two times a day (BID) | ORAL | Status: DC

## 2014-04-15 NOTE — Telephone Encounter (Signed)
PA approved through cover my meds

## 2014-04-15 NOTE — Patient Instructions (Signed)
Please read instructions below.  Take Celebrex twice daily over the next week.  Avoid chewing on the left side.  Apply topical Aspercreme to area.  Use auralgan (ear drop) to see if it helps some with symptoms.   Temporomandibular Problems  Temporomandibular joint (TMJ) dysfunction means there are problems with the joint between your jaw and your skull. This is a joint lined by cartilage like other joints in your body but also has a small disc in the joint which keeps the bones from rubbing on each other. These joints are like other joints and can get inflamed (sore) from arthritis and other problems. When this joint gets sore, it can cause headaches and pain in the jaw and the face.  CAUSES  Usually the arthritic types of problems are caused by soreness in the joint. Soreness in the joint can also be caused by overuse. This may come from grinding your teeth. It may also come from mis-alignment in the joint.  DIAGNOSIS Diagnosis of this condition can often be made by history and exam. Sometimes your caregiver may need X-rays or an MRI scan to determine the exact cause. It may be necessary to see your dentist to determine if your teeth and jaws are lined up correctly.  TREATMENT  Most of the time this problem is not serious; however, sometimes it can persist (become chronic). When this happens medications that will cut down on inflammation (soreness) help. Sometimes a shot of cortisone into the joint will be helpful. If your teeth are not aligned it may help for your dentist to make a splint for your mouth that can help this problem. If no physical problems can be found, the problem may come from tension. If tension is found to be the cause, biofeedback or relaxation techniques may be helpful.  HOME CARE INSTRUCTIONS   Later in the day, applications of ice packs may be helpful. Ice can be used in a plastic bag with a towel around it to prevent frostbite to skin. This may be used about every 2 hours  for 20 to 30 minutes, as needed while awake, or as directed by your caregiver.  Only take over-the-counter or prescription medicines for pain, discomfort, or fever as directed by your caregiver.  If physical therapy was prescribed, follow your caregiver's directions.  Wear mouth appliances as directed if they were given. Document Released: 08/21/2001 Document Revised: 02/18/2012 Document Reviewed: 11/28/2008 [facility] Patient Information 2014 [place], [place].

## 2014-04-15 NOTE — Progress Notes (Signed)
Patient presents to clinic today c/o 1.5 weeks of pain around his left ear.  Patient denies fever, chills, cough, nasal congestion or other respiratory symptom. States pain is worse with eating and movement of his mouth. Has noticed a popping sound in his jaw. Denies history of trauma or injury to his jaw. Denies pain of right ear. Denies tinnitus or change in hearing. Denies drainage from ear.   Past Medical History  Diagnosis Date  . Elevated BP 10/03/2012  . Obese 10/03/2012  . Chicken pox as a child  . Measles as a child  . Mumps as a child  . Kidney stones 10/10/2012  . Hearing loss 10/10/2012  . Onychomycosis 10/10/2012  . ED (erectile dysfunction) 10/10/2012  . 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 Preventative health care 10/10/2012  . Anxiety and depression 01/27/2013  . Depression with anxiety 01/27/2013    Current Outpatient Prescriptions on File Prior to Visit  Medication Sig Dispense Refill  . acyclovir (ZOVIRAX) 800 MG tablet Take 1 tablet (800 mg total) by mouth 3 (three) times daily as needed.  90 tablet  2  . ALPRAZolam (XANAX) 0.25 MG tablet Take 1 tablet (0.25 mg total) by mouth daily as needed for anxiety or sleep.  30 tablet  3  . aspirin EC 81 MG tablet Take 1 tablet (81 mg total) by mouth daily.      Marland Kitchen escitalopram (LEXAPRO) 5 MG tablet Take 1 tablet (5 mg total) by mouth daily.  90 tablet  0  . ranitidine (ZANTAC) 300 MG tablet Take 1 tablet (300 mg total) by mouth at bedtime.  30 tablet  5  . tadalafil (CIALIS) 5 MG tablet Take 1 tablet (5 mg total) by mouth daily as needed for erectile dysfunction.  30 tablet  5   No current facility-administered medications on file prior to visit.    Allergies  Allergen Reactions  . Penicillins     Family History  Problem Relation Age of Onset  . Cancer Mother     breast, bone  . Hyperlipidemia Father   . Other Father     prostate surgery  . Heart attack Maternal Grandmother   . Kidney disease Maternal Grandfather     kidney  failure    History   Social History  . Marital Status: Married    Spouse Name: N/A    Number of Children: N/A  . Years of Education: N/A   Social History Main Topics  . Smoking status: Former Smoker    Types: Cigarettes    Start date: 12/11/1991  . Smokeless tobacco: Never Used     Comment: smoked on weekends  . Alcohol Use: Yes     Comment: 1 a day  . Drug Use: No  . Sexual Activity: Yes   Other Topics Concern  . None   Social History Narrative  . None   Review of Systems - See HPI.  All other ROS are negative.  BP 122/82  Pulse 80  Temp(Src) 97.8 F (36.6 C) (Oral)  Wt 251 lb 1.3 oz (113.889 kg)  SpO2 96%  Physical Exam  Vitals reviewed. Constitutional: He is oriented to person, place, and time and well-developed, well-nourished, and in no distress.  HENT:  Head: Normocephalic and atraumatic.  Right Ear: Tympanic membrane, external ear and ear canal normal.  Left Ear: Tympanic membrane, external ear and ear canal normal.  Nose: Nose normal.  Mouth/Throat: Oropharynx is clear and moist. No oropharyngeal exudate.   Positive pain with  palpation of the TMJ joint. Also pain with range of motion of the mandible, consistent with TMJ dysfunction. No evidence of dental or gingival infection.  Eyes: Conjunctivae are normal. Pupils are equal, round, and reactive to light.  Neck: Neck supple. No thyromegaly present.  Cardiovascular: Normal rate, regular rhythm, normal heart sounds and intact distal pulses.   Pulmonary/Chest: Effort normal and breath sounds normal. No respiratory distress. He has no wheezes. He has no rales. He exhibits no tenderness.  Lymphadenopathy:    He has no cervical adenopathy.  Neurological: He is alert and oriented to person, place, and time.  Skin: Skin is warm and dry. No rash noted.  Psychiatric: Affect normal.   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 Topical Aspercreme icy hot applied around the ear. Rx Auralgan to  help with the ear pain, although as discussed is coming from his TMJ joint. Apply ice to area. If symptoms continue to persist despite conservative measures, may need additional therapy and possible physical therapy.

## 2014-04-15 NOTE — Progress Notes (Signed)
Pre visit review using our clinic review tool, if applicable. No additional management support is needed unless otherwise documented below in the visit note. 

## 2014-04-15 NOTE — Assessment & Plan Note (Signed)
Rx Celebrex to take daily. Topical Aspercreme icy hot applied around the ear. Rx Auralgan to help with the ear pain, although as discussed is coming from his TMJ joint. Apply ice to area. If symptoms continue to persist despite conservative measures, may need additional therapy and possible physical therapy.

## 2014-04-16 ENCOUNTER — Other Ambulatory Visit: Payer: Self-pay | Admitting: *Deleted

## 2014-04-16 MED ORDER — CELECOXIB 200 MG PO CAPS: 200.0000 mg | ORAL_CAPSULE | Freq: Two times a day (BID) | ORAL | Status: DC

## 2014-04-16 NOTE — Progress Notes (Signed)
Samples given d/t cost of medication per provider VO/SLS

## 2014-06-01 ENCOUNTER — Encounter: Payer: Managed Care, Other (non HMO) | Admitting: Family Medicine

## 2014-06-18 ENCOUNTER — Encounter: Payer: Self-pay | Admitting: Family Medicine

## 2014-06-18 ENCOUNTER — Ambulatory Visit (INDEPENDENT_AMBULATORY_CARE_PROVIDER_SITE_OTHER): Payer: Managed Care, Other (non HMO) | Admitting: Family Medicine

## 2014-06-18 VITALS — BP 122/80 | HR 78 | Temp 97.5°F | Ht 75.0 in | Wt 251.0 lb

## 2014-06-18 DIAGNOSIS — N200 Calculus of kidney: Secondary | ICD-10-CM

## 2014-06-18 DIAGNOSIS — F32A Depression, unspecified: Secondary | ICD-10-CM

## 2014-06-18 DIAGNOSIS — F411 Generalized anxiety disorder: Secondary | ICD-10-CM

## 2014-06-18 DIAGNOSIS — B009 Herpesviral infection, unspecified: Secondary | ICD-10-CM

## 2014-06-18 DIAGNOSIS — Z0000 Encounter for general adult medical examination without abnormal findings: Secondary | ICD-10-CM

## 2014-06-18 DIAGNOSIS — M26609 Unspecified temporomandibular joint disorder, unspecified side: Secondary | ICD-10-CM

## 2014-06-18 DIAGNOSIS — N529 Male erectile dysfunction, unspecified: Secondary | ICD-10-CM

## 2014-06-18 DIAGNOSIS — Z87898 Personal history of other specified conditions: Secondary | ICD-10-CM

## 2014-06-18 DIAGNOSIS — K219 Gastro-esophageal reflux disease without esophagitis: Secondary | ICD-10-CM

## 2014-06-18 DIAGNOSIS — F419 Anxiety disorder, unspecified: Secondary | ICD-10-CM

## 2014-06-18 DIAGNOSIS — F329 Major depressive disorder, single episode, unspecified: Secondary | ICD-10-CM

## 2014-06-18 DIAGNOSIS — H60399 Other infective otitis externa, unspecified ear: Secondary | ICD-10-CM

## 2014-06-18 DIAGNOSIS — IMO0001 Reserved for inherently not codable concepts without codable children: Secondary | ICD-10-CM

## 2014-06-18 DIAGNOSIS — R52 Pain, unspecified: Secondary | ICD-10-CM

## 2014-06-18 DIAGNOSIS — B0089 Other herpesviral infection: Secondary | ICD-10-CM

## 2014-06-18 DIAGNOSIS — Z23 Encounter for immunization: Secondary | ICD-10-CM

## 2014-06-18 DIAGNOSIS — E785 Hyperlipidemia, unspecified: Secondary | ICD-10-CM

## 2014-06-18 DIAGNOSIS — E7800 Pure hypercholesterolemia, unspecified: Secondary | ICD-10-CM

## 2014-06-18 DIAGNOSIS — R030 Elevated blood-pressure reading, without diagnosis of hypertension: Secondary | ICD-10-CM

## 2014-06-18 DIAGNOSIS — H9193 Unspecified hearing loss, bilateral: Secondary | ICD-10-CM

## 2014-06-18 DIAGNOSIS — R7309 Other abnormal glucose: Secondary | ICD-10-CM

## 2014-06-18 DIAGNOSIS — F418 Other specified anxiety disorders: Secondary | ICD-10-CM

## 2014-06-18 DIAGNOSIS — F341 Dysthymic disorder: Secondary | ICD-10-CM

## 2014-06-18 DIAGNOSIS — R739 Hyperglycemia, unspecified: Secondary | ICD-10-CM

## 2014-06-18 DIAGNOSIS — H6090 Unspecified otitis externa, unspecified ear: Secondary | ICD-10-CM

## 2014-06-18 LAB — HEMOGLOBIN A1C
Hgb A1c MFr Bld: 5.2 % (ref <5.7)
Mean Plasma Glucose: 103 mg/dL (ref <117)

## 2014-06-18 LAB — RENAL FUNCTION PANEL
ALBUMIN: 4.3 g/dL (ref 3.5–5.2)
BUN: 15 mg/dL (ref 6–23)
CALCIUM: 8.9 mg/dL (ref 8.4–10.5)
CO2: 24 meq/L (ref 19–32)
Chloride: 106 mEq/L (ref 96–112)
Creat: 0.93 mg/dL (ref 0.50–1.35)
Glucose, Bld: 107 mg/dL — ABNORMAL HIGH (ref 70–99)
PHOSPHORUS: 1.9 mg/dL — AB (ref 2.3–4.6)
Potassium: 4 mEq/L (ref 3.5–5.3)
SODIUM: 138 meq/L (ref 135–145)

## 2014-06-18 LAB — LIPID PANEL
CHOL/HDL RATIO: 5.4 ratio
CHOLESTEROL: 206 mg/dL — AB (ref 0–200)
HDL: 38 mg/dL — AB (ref >39)
LDL Cholesterol: 136 mg/dL — ABNORMAL HIGH (ref 0–99)
TRIGLYCERIDES: 160 mg/dL — AB (ref <150)
VLDL: 32 mg/dL (ref 0–40)

## 2014-06-18 LAB — HEPATIC FUNCTION PANEL
ALBUMIN: 4.3 g/dL (ref 3.5–5.2)
ALT: 17 U/L (ref 0–53)
AST: 17 U/L (ref 0–37)
Alkaline Phosphatase: 63 U/L (ref 39–117)
BILIRUBIN DIRECT: 0.1 mg/dL (ref 0.0–0.3)
BILIRUBIN TOTAL: 0.5 mg/dL (ref 0.2–1.2)
Indirect Bilirubin: 0.4 mg/dL (ref 0.2–1.2)
Total Protein: 7 g/dL (ref 6.0–8.3)

## 2014-06-18 LAB — CBC
HCT: 42.7 % (ref 39.0–52.0)
Hemoglobin: 15.1 g/dL (ref 13.0–17.0)
MCH: 31.1 pg (ref 26.0–34.0)
MCHC: 35.4 g/dL (ref 30.0–36.0)
MCV: 87.9 fL (ref 78.0–100.0)
PLATELETS: 170 10*3/uL (ref 150–400)
RBC: 4.86 MIL/uL (ref 4.22–5.81)
RDW: 13.5 % (ref 11.5–15.5)
WBC: 3.7 10*3/uL — ABNORMAL LOW (ref 4.0–10.5)

## 2014-06-18 LAB — TSH: TSH: 2.968 u[IU]/mL (ref 0.350–4.500)

## 2014-06-18 MED ORDER — ESCITALOPRAM OXALATE 5 MG PO TABS: 5.0000 mg | ORAL_TABLET | Freq: Every day | ORAL | Status: DC

## 2014-06-18 MED ORDER — ZOSTER VACCINE LIVE 19400 UNT/0.65ML ~~LOC~~ SOLR: 0.6500 mL | Freq: Once | SUBCUTANEOUS | Status: DC

## 2014-06-18 MED ORDER — CELECOXIB 200 MG PO CAPS: 200.0000 mg | ORAL_CAPSULE | Freq: Two times a day (BID) | ORAL | Status: DC

## 2014-06-18 MED ORDER — TADALAFIL 5 MG PO TABS: 5.0000 mg | ORAL_TABLET | Freq: Every day | ORAL | Status: DC | PRN

## 2014-06-18 MED ORDER — RANITIDINE HCL 300 MG PO TABS: 300.0000 mg | ORAL_TABLET | Freq: Every day | ORAL | Status: DC

## 2014-06-18 MED ORDER — ANTIPYRINE-BENZOCAINE 5.4-1.4 % OT SOLN: 3.0000 [drp] | OTIC | Status: DC | PRN

## 2014-06-18 MED ORDER — ALPRAZOLAM 0.25 MG PO TABS: 0.2500 mg | ORAL_TABLET | Freq: Every day | ORAL | Status: DC | PRN

## 2014-06-18 MED ORDER — ACYCLOVIR 800 MG PO TABS: 800.0000 mg | ORAL_TABLET | Freq: Three times a day (TID) | ORAL | Status: DC | PRN

## 2014-06-18 NOTE — Assessment & Plan Note (Signed)
Check psa today

## 2014-06-18 NOTE — Progress Notes (Signed)
Pre visit review using our clinic review tool, if applicable. No additional management support is needed unless otherwise documented below in the visit note. 

## 2014-06-18 NOTE — Progress Notes (Signed)
Patient ID: Gerald Booth, male   DOB: 1956/10/27, 58 y.o.   MRN: [id number] Gerald Booth [id number] July 31, 1956 06/18/2014      Progress Note-Follow Up  Subjective  Chief Complaint  Chief Complaint  Patient presents with  . Annual Exam    physical  . Injections    zostavax    HPI  Patient is a 58 year old male in today for routine medical care. In for annual exam. Overall doing well. Is reporting the Lexapro at low doses is helping to stabilize his mood. He still has had moments but no significant anhedonia. No suicidal ideation. Work has been stressful so his anxiety from [place]. He is using alprazolam infrequently. No recent illness. His kidney stones have not flared. He does occasionally have trouble with his HSV dermatitis but responds well to acyclovir. Denies CP/palp/SOB/HA/congestion/fevers/GI or GU c/o. Taking meds as prescribed, his TMJ flares up at times but responds well to Aspercreme prn  Past Medical History  Diagnosis Date  . Elevated BP 10/03/2012  . Obese 10/03/2012  . Chicken pox as a child  . Measles as a child  . Mumps as a child  . Kidney stones 10/10/2012  . Hearing loss 10/10/2012  . Onychomycosis 10/10/2012  . ED (erectile dysfunction) 10/10/2012  . 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 Preventative health care 10/10/2012  . Anxiety and depression 01/27/2013  . Depression with anxiety 01/27/2013    Past Surgical History  Procedure Laterality Date  . Wisdom tooth extraction  2012    1 tooth    Family History  Problem Relation Age of Onset  . Cancer Mother     breast, bone  . Hyperlipidemia Father   . Other Father     prostate surgery  . Heart attack Maternal Grandmother   . Kidney disease Maternal Grandfather     kidney failure    History   Social History  . Marital Status: Married    Spouse Name: N/A    Number of Children: N/A  . Years of Education: N/A   Occupational History  . Not on file.   Social History Main Topics  . Smoking  status: Former Smoker    Types: Cigarettes    Start date: 12/11/1991  . Smokeless tobacco: Never Used     Comment: smoked on weekends  . Alcohol Use: Yes     Comment: 1 a day  . Drug Use: No  . Sexual Activity: Yes   Other Topics Concern  . Not on file   Social History Narrative  . No narrative on file    Current Outpatient Prescriptions on File Prior to Visit  Medication Sig Dispense Refill  . aspirin EC 81 MG tablet Take 1 tablet (81 mg total) by mouth daily.       No current facility-administered medications on file prior to visit.    Allergies  Allergen Reactions  . Penicillins     Review of Systems  Review of Systems  Constitutional: Negative for fever and malaise/fatigue.  HENT: Negative for congestion.   Eyes: Negative for discharge.  Respiratory: Negative for shortness of breath.   Cardiovascular: Negative for chest pain, palpitations and leg swelling.  Gastrointestinal: Negative for nausea, abdominal pain and diarrhea.  Genitourinary: Negative for dysuria.  Musculoskeletal: Negative for falls.  Skin: Negative for rash.  Neurological: Negative for loss of consciousness and headaches.  Endo/Heme/Allergies: Negative for polydipsia.  Psychiatric/Behavioral: Positive for depression. Negative for suicidal ideas. The patient is nervous/anxious and has  insomnia.     Objective  BP 122/80  Pulse 78  Temp(Src) 97.5 F (36.4 C) (Oral)  Ht 6\' 3"  (1.905 m)  Wt 251 lb (113.853 kg)  BMI 31.37 kg/m2  SpO2 94%  Physical Exam  Physical Exam  Constitutional: He is oriented to person, place, and time and well-developed, well-nourished, and in no distress. No distress.  HENT:  Head: Normocephalic and atraumatic.  Eyes: Conjunctivae are normal.  Neck: Neck supple. No thyromegaly present.  Cardiovascular: Normal rate, regular rhythm and normal heart sounds.   No murmur heard. Pulmonary/Chest: Effort normal and breath sounds normal. No respiratory distress.   Abdominal: He exhibits no distension and no mass. There is no tenderness.  Musculoskeletal: He exhibits no edema.  Neurological: He is alert and oriented to person, place, and time.  Skin: Skin is warm.  Psychiatric: Memory, affect and judgment normal.    Lab Results  Component Value Date   TSH 3.19 10/03/2012   Lab Results  Component Value Date   WBC 6.0 12/04/2013   HGB 16.1 12/04/2013   HCT 44.5 12/04/2013   MCV 88.6 12/04/2013   PLT 164 12/04/2013   Lab Results  Component Value Date   CREATININE 1.05 12/04/2013   BUN 13 12/04/2013   NA 138 12/04/2013   K 4.2 12/04/2013   CL 103 12/04/2013   CO2 26 12/04/2013   Lab Results  Component Value Date   ALT 28 12/04/2013   AST 22 12/04/2013   ALKPHOS 72 12/04/2013   BILITOT 0.9 12/04/2013   Lab Results  Component Value Date   CHOL 240* 12/04/2013   Lab Results  Component Value Date   HDL 43 12/04/2013   Lab Results  Component Value Date   LDLCALC 143* 12/04/2013   Lab Results  Component Value Date   TRIG 270* 12/04/2013   Lab Results  Component Value Date   CHOLHDL 5.6 12/04/2013     Assessment & Plan  HYPERCHOLESTEROLEMIA Encouraged heart healthy diet, increase exercise, avoid trans fats, consider a krill oil cap daily recheck lipids today  GERD Avoid offending foods, start probiotics. Do not eat large meals in late evening and consider raising head of bed. 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 Consider daily probiotics. Take medications as prescribed. Fasting labs obtained today. Zostavax given today. Declines referral for colonoscopy today. Is considering a referral from his urologist but may call for referral from here  Elevated BP Well controlled, no changes to meds. Encouraged heart healthy diet such as the DASH diet and exercise as tolerated.   BENIGN PROSTATIC HYPERTROPHY, HX OF Check psa today  Hyperglycemia Minimize simple carbs increase exercise and check hgba1c

## 2014-06-18 NOTE — Patient Instructions (Signed)
DASH Eating Plan  DASH stands for "Dietary Approaches to Stop Hypertension." The DASH eating plan is a healthy eating plan that has been shown to reduce high blood pressure (hypertension). Additional health benefits may include reducing the risk of type 2 diabetes mellitus, heart disease, and stroke. The DASH eating plan may also help with weight loss.  WHAT DO I NEED TO KNOW ABOUT THE DASH EATING PLAN?  For the DASH eating plan, you will follow these general guidelines:  · Choose foods with a percent daily value for sodium of less than 5% (as listed on the food label).  · Use salt-free seasonings or herbs instead of table salt or sea salt.  · Check with your health care provider or pharmacist before using salt substitutes.  · Eat lower-sodium products, often labeled as "lower sodium" or "no salt added."  · Eat fresh foods.  · Eat more vegetables, fruits, and low-fat dairy products.  · Choose whole grains. Look for the word "whole" as the first word in the ingredient list.  · Choose fish and skinless chicken or turkey more often than red meat. Limit fish, poultry, and meat to 6 oz (170 g) each day.  · Limit sweets, desserts, sugars, and sugary drinks.  · Choose heart-healthy fats.  · Limit cheese to 1 oz (28 g) per day.  · Eat more home-cooked food and less restaurant, buffet, and fast food.  · Limit fried foods.  · Cook foods using methods other than frying.  · Limit canned vegetables. If you do use them, rinse them well to decrease the sodium.  · When eating at a restaurant, ask that your food be prepared with less salt, or no salt if possible.  WHAT FOODS CAN I EAT?  Seek help from a dietitian for individual calorie needs.  Grains  Whole grain or whole wheat bread. Brown rice. Whole grain or whole wheat pasta. Quinoa, bulgur, and whole grain cereals. Low-sodium cereals. Corn or whole wheat flour tortillas. Whole grain cornbread. Whole grain crackers. Low-sodium crackers.  Vegetables  Fresh or frozen vegetables  (raw, steamed, roasted, or grilled). Low-sodium or reduced-sodium tomato and vegetable juices. Low-sodium or reduced-sodium tomato sauce and paste. Low-sodium or reduced-sodium canned vegetables.   Fruits  All fresh, canned (in natural juice), or frozen fruits.  Meat and Other Protein Products  Ground beef (85% or leaner), grass-fed beef, or beef trimmed of fat. Skinless chicken or turkey. Ground chicken or turkey. Pork trimmed of fat. All fish and seafood. Eggs. Dried beans, peas, or lentils. Unsalted nuts and seeds. Unsalted canned beans.  Dairy  Low-fat dairy products, such as skim or 1% milk, 2% or reduced-fat cheeses, low-fat ricotta or cottage cheese, or plain low-fat yogurt. Low-sodium or reduced-sodium cheeses.  Fats and Oils  Tub margarines without trans fats. Light or reduced-fat mayonnaise and salad dressings (reduced sodium). Avocado. Safflower, olive, or canola oils. Natural peanut or almond butter.  Other  Unsalted popcorn and pretzels.  The items listed above may not be a complete list of recommended foods or beverages. Contact your dietitian for more options.  WHAT FOODS ARE NOT RECOMMENDED?  Grains  White bread. White pasta. White rice. Refined cornbread. Bagels and croissants. Crackers that contain trans fat.  Vegetables  Creamed or fried vegetables. Vegetables in a cheese sauce. Regular canned vegetables. Regular canned tomato sauce and paste. Regular tomato and vegetable juices.  Fruits  Dried fruits. Canned fruit in light or heavy syrup. Fruit juice.  Meat and Other Protein   Products  Fatty cuts of meat. Ribs, chicken wings, bacon, sausage, bologna, salami, chitterlings, fatback, hot dogs, bratwurst, and packaged luncheon meats. Salted nuts and seeds. Canned beans with salt.  Dairy  Whole or 2% milk, cream, half-and-half, and cream cheese. Whole-fat or sweetened yogurt. Full-fat cheeses or blue cheese. Nondairy creamers and whipped toppings. Processed cheese, cheese spreads, or cheese  curds.  Condiments  Onion and garlic salt, seasoned salt, table salt, and sea salt. Canned and packaged gravies. Worcestershire sauce. Tartar sauce. Barbecue sauce. Teriyaki sauce. Soy sauce, including reduced sodium. Steak sauce. Fish sauce. Oyster sauce. Cocktail sauce. Horseradish. Ketchup and mustard. Meat flavorings and tenderizers. Bouillon cubes. Hot sauce. Tabasco sauce. Marinades. Taco seasonings. Relishes.  Fats and Oils  Butter, stick margarine, lard, shortening, ghee, and bacon fat. Coconut, palm kernel, or palm oils. Regular salad dressings.  Other  Pickles and olives. Salted popcorn and pretzels.  The items listed above may not be a complete list of foods and beverages to avoid. Contact your dietitian for more information.  WHERE CAN I FIND MORE INFORMATION?  National Heart, Lung, and [employer] Institute: www.nhlbi.nih.gov/health/health-topics/topics/dash/  Document Released: 11/15/2011 Document Revised: 12/01/2013 Document Reviewed: 09/30/2013  [employer]® Patient Information ©2015 [employer]. This information is not intended to replace advice given to you by your health care provider. Make sure you discuss any questions you have with your health care provider.

## 2014-06-18 NOTE — Assessment & Plan Note (Signed)
Well controlled, no changes to meds. Encouraged heart healthy diet such as the DASH diet and exercise as tolerated.  °

## 2014-06-18 NOTE — Assessment & Plan Note (Signed)
Does well with occasional doses of Acyclovir, given refills today

## 2014-06-18 NOTE — Assessment & Plan Note (Signed)
Avoid offending foods, start probiotics. Do not eat large meals in late evening and consider raising head of bed. Refill given on Ranitidine

## 2014-06-18 NOTE — Assessment & Plan Note (Signed)
Refill given on cialis

## 2014-06-18 NOTE — Assessment & Plan Note (Addendum)
Patient encouraged to maintain heart healthy diet, regular exercise, adequate sleep. Consider daily probiotics. Take medications as prescribed. Fasting labs obtained today. Zostavax given today. Declines referral for colonoscopy today. Is considering a referral from his urologist but may call for referral from here

## 2014-06-18 NOTE — Assessment & Plan Note (Signed)
Doing well on low dose Lexapro and needs Alprazolam infrequently

## 2014-06-18 NOTE — Assessment & Plan Note (Signed)
Doing well, has appt soon with urology Dr Rosana Hoes, maintain adequate hydration

## 2014-06-18 NOTE — Assessment & Plan Note (Signed)
Uses Auralgan infrequently prn for discomfort is allowed a refill

## 2014-06-18 NOTE — Assessment & Plan Note (Signed)
Has a hearing aide doing well

## 2014-06-18 NOTE — Assessment & Plan Note (Signed)
Encouraged heart healthy diet, increase exercise, avoid trans fats, consider a krill oil cap daily recheck lipids today

## 2014-06-18 NOTE — Assessment & Plan Note (Signed)
Using Aspercreme prn with good results

## 2014-06-18 NOTE — Assessment & Plan Note (Signed)
Minimize simple carbs increase exercise and check hgba1c

## 2014-06-19 LAB — PSA: PSA: 0.57 ng/mL (ref <=4.00)

## 2014-09-24 ENCOUNTER — Ambulatory Visit: Payer: Managed Care, Other (non HMO) | Admitting: Family Medicine

## 2014-09-28 ENCOUNTER — Ambulatory Visit: Payer: Managed Care, Other (non HMO)

## 2014-10-05 ENCOUNTER — Ambulatory Visit (INDEPENDENT_AMBULATORY_CARE_PROVIDER_SITE_OTHER): Payer: Managed Care, Other (non HMO) | Admitting: *Deleted

## 2014-10-05 DIAGNOSIS — Z23 Encounter for immunization: Secondary | ICD-10-CM

## 2014-10-16 ENCOUNTER — Other Ambulatory Visit: Payer: Self-pay | Admitting: Family Medicine

## 2014-11-01 ENCOUNTER — Ambulatory Visit: Payer: Managed Care, Other (non HMO) | Admitting: Family Medicine

## 2014-12-21 ENCOUNTER — Ambulatory Visit: Payer: Managed Care, Other (non HMO) | Admitting: Family Medicine

## 2015-01-17 ENCOUNTER — Ambulatory Visit: Payer: Managed Care, Other (non HMO) | Admitting: Family Medicine

## 2015-02-15 ENCOUNTER — Ambulatory Visit: Payer: Managed Care, Other (non HMO) | Admitting: Family Medicine

## 2015-03-29 ENCOUNTER — Ambulatory Visit (INDEPENDENT_AMBULATORY_CARE_PROVIDER_SITE_OTHER): Payer: Managed Care, Other (non HMO) | Admitting: Family Medicine

## 2015-03-29 ENCOUNTER — Encounter: Payer: Self-pay | Admitting: Family Medicine

## 2015-03-29 VITALS — BP 122/86 | HR 96 | Temp 98.6°F | Ht 75.0 in | Wt 243.0 lb

## 2015-03-29 DIAGNOSIS — Z0000 Encounter for general adult medical examination without abnormal findings: Secondary | ICD-10-CM | POA: Diagnosis not present

## 2015-03-29 DIAGNOSIS — F419 Anxiety disorder, unspecified: Secondary | ICD-10-CM

## 2015-03-29 DIAGNOSIS — F329 Major depressive disorder, single episode, unspecified: Secondary | ICD-10-CM

## 2015-03-29 DIAGNOSIS — E669 Obesity, unspecified: Secondary | ICD-10-CM

## 2015-03-29 DIAGNOSIS — R030 Elevated blood-pressure reading, without diagnosis of hypertension: Secondary | ICD-10-CM

## 2015-03-29 DIAGNOSIS — R739 Hyperglycemia, unspecified: Secondary | ICD-10-CM

## 2015-03-29 DIAGNOSIS — B351 Tinea unguium: Secondary | ICD-10-CM

## 2015-03-29 DIAGNOSIS — K219 Gastro-esophageal reflux disease without esophagitis: Secondary | ICD-10-CM

## 2015-03-29 DIAGNOSIS — IMO0001 Reserved for inherently not codable concepts without codable children: Secondary | ICD-10-CM

## 2015-03-29 DIAGNOSIS — F418 Other specified anxiety disorders: Secondary | ICD-10-CM | POA: Diagnosis not present

## 2015-03-29 DIAGNOSIS — E782 Mixed hyperlipidemia: Secondary | ICD-10-CM

## 2015-03-29 DIAGNOSIS — F32A Depression, unspecified: Secondary | ICD-10-CM

## 2015-03-29 MED ORDER — RANITIDINE HCL 300 MG PO TABS: 300.0000 mg | ORAL_TABLET | Freq: Every day | ORAL | Status: DC

## 2015-03-29 MED ORDER — ESCITALOPRAM OXALATE 5 MG PO TABS: 5.0000 mg | ORAL_TABLET | Freq: Every day | ORAL | Status: DC

## 2015-03-29 MED ORDER — ALPRAZOLAM 0.25 MG PO TABS: 0.2500 mg | ORAL_TABLET | Freq: Every day | ORAL | Status: DC | PRN

## 2015-03-29 MED ORDER — CELECOXIB 200 MG PO CAPS: ORAL_CAPSULE | ORAL | Status: DC

## 2015-03-29 MED ORDER — EFINACONAZOLE 10 % EX SOLN: 1.0000 "application " | Freq: Every day | CUTANEOUS | Status: DC

## 2015-03-29 NOTE — Progress Notes (Signed)
Pre visit review using our clinic review tool, if applicable. No additional management support is needed unless otherwise documented below in the visit note. 

## 2015-03-29 NOTE — Patient Instructions (Signed)

## 2015-03-31 ENCOUNTER — Telehealth: Payer: Self-pay | Admitting: Family Medicine

## 2015-03-31 NOTE — Telephone Encounter (Signed)
Caller name: Apfel,Marty Relation to pt: spouse  Call back number: [phone]   Reason for call:  Spouse calling in regards to picking up the "kit"

## 2015-03-31 NOTE — Telephone Encounter (Signed)
Patients wife informed to have the patient come by the office to sign/date Cologuard form, have front staff return to PCP's CMA to fax.

## 2015-03-31 NOTE — Telephone Encounter (Signed)
Called left message to call back 

## 2015-04-01 NOTE — Telephone Encounter (Signed)
Patient did come by the office and signed order form for Cologuard.  I then faxed completed form to [phone] and did received fax confirmation form received.

## 2015-04-03 NOTE — Assessment & Plan Note (Signed)
Encouraged DASH diet, decrease po intake and increase exercise as tolerated. Needs 7-8 hours of sleep nightly. Avoid trans fats, eat small, frequent meals every 4-5 hours with lean proteins, complex carbs and healthy fats. Minimize simple carbs, GMO foods. 

## 2015-04-03 NOTE — Assessment & Plan Note (Signed)
Avoid offending foods, start probiotics. Do not eat large meals in late evening and consider raising head of bed.  

## 2015-04-03 NOTE — Progress Notes (Signed)
Gerald Booth  [id number] 1956/07/14 04/03/2015      Progress Note-Follow Up  Subjective  Chief Complaint  Chief Complaint  Patient presents with  . Follow-up    HPI  Patient is a 59 y.o. male in today for routine medical care. Patient is in today for follow-up. Is generally doing well but does continue to struggle with significant stressors and is requesting a refill on alprazolam which is used infrequently. Otherwise feels well. No recent illness or acute complaints. Denies CP/palp/SOB/HA/congestion/fevers/GI or GU c/o. Taking meds as prescribed  Past Medical History  Diagnosis Date  . Elevated BP 10/03/2012  . Obese 10/03/2012  . Chicken pox as a child  . Measles as a child  . Mumps as a child  . Kidney stones 10/10/2012  . Hearing loss 10/10/2012  . Onychomycosis 10/10/2012  . ED (erectile dysfunction) 10/10/2012  . 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 Preventative health care 10/10/2012  . Anxiety and depression 01/27/2013  . Depression with anxiety 01/27/2013    Past Surgical History  Procedure Laterality Date  . Wisdom tooth extraction  2012    1 tooth    Family History  Problem Relation Age of Onset  . Cancer Mother     breast, bone  . Hyperlipidemia Father   . Other Father     prostate surgery  . Heart attack Maternal Grandmother   . Kidney disease Maternal Grandfather     kidney failure    History   Social History  . Marital Status: Married    Spouse Name: N/A  . Number of Children: N/A  . Years of Education: N/A   Occupational History  . Not on file.   Social History Main Topics  . Smoking status: Former Smoker    Types: Cigarettes    Start date: 12/11/1991  . Smokeless tobacco: Never Used     Comment: smoked on weekends  . Alcohol Use: Yes     Comment: 1 a day  . Drug Use: No  . Sexual Activity: Yes   Other Topics Concern  . Not on file   Social History Narrative    Current Outpatient Prescriptions on File Prior to Visit    Medication Sig Dispense Refill  . aspirin EC 81 MG tablet Take 1 tablet (81 mg total) by mouth daily.    . tadalafil (CIALIS) 5 MG tablet Take 1 tablet (5 mg total) by mouth daily as needed for erectile dysfunction. 30 tablet 5   No current facility-administered medications on file prior to visit.    Allergies  Allergen Reactions  . Penicillins     Nose bleeds    Review of Systems  Review of Systems  Constitutional: Negative for fever and malaise/fatigue.  HENT: Negative for congestion.   Eyes: Negative for discharge.  Respiratory: Negative for shortness of breath.   Cardiovascular: Negative for chest pain, palpitations and leg swelling.  Gastrointestinal: Negative for nausea, abdominal pain and diarrhea.  Genitourinary: Negative for dysuria.  Musculoskeletal: Negative for falls.  Skin: Negative for rash.  Neurological: Negative for loss of consciousness and headaches.  Endo/Heme/Allergies: Negative for polydipsia.  Psychiatric/Behavioral: Negative for depression and suicidal ideas. The patient is not nervous/anxious and does not have insomnia.     Objective  BP 122/86 mmHg  Pulse 96  Temp(Src) 98.6 F (37 C) (Oral)  Ht 6\' 3"  (1.905 m)  Wt 243 lb (110.224 kg)  BMI 30.37 kg/m2  SpO2 96%  Physical Exam  Physical Exam  Constitutional:  He is oriented to person, place, and time and well-developed, well-nourished, and in no distress. No distress.  HENT:  Head: Normocephalic and atraumatic.  Eyes: Conjunctivae are normal.  Neck: Neck supple. No thyromegaly present.  Cardiovascular: Normal rate, regular rhythm and normal heart sounds.   No murmur heard. Pulmonary/Chest: Effort normal and breath sounds normal. No respiratory distress.  Abdominal: He exhibits no distension and no mass. There is no tenderness.  Musculoskeletal: He exhibits no edema.  Neurological: He is alert and oriented to person, place, and time.  Skin: Skin is warm.  Psychiatric: Memory, affect and  judgment normal.    Lab Results  Component Value Date   TSH 2.968 06/18/2014   Lab Results  Component Value Date   WBC 3.7* 06/18/2014   HGB 15.1 06/18/2014   HCT 42.7 06/18/2014   MCV 87.9 06/18/2014   PLT 170 06/18/2014   Lab Results  Component Value Date   CREATININE 0.93 06/18/2014   BUN 15 06/18/2014   NA 138 06/18/2014   K 4.0 06/18/2014   CL 106 06/18/2014   CO2 24 06/18/2014   Lab Results  Component Value Date   ALT 17 06/18/2014   AST 17 06/18/2014   ALKPHOS 63 06/18/2014   BILITOT 0.5 06/18/2014   Lab Results  Component Value Date   CHOL 206* 06/18/2014   Lab Results  Component Value Date   HDL 38* 06/18/2014   Lab Results  Component Value Date   LDLCALC 136* 06/18/2014   Lab Results  Component Value Date   TRIG 160* 06/18/2014   Lab Results  Component Value Date   CHOLHDL 5.4 06/18/2014     Assessment & Plan  GERD Avoid offending foods, start probiotics. Do not eat large meals in late evening and consider raising head of bed.    Elevated BP Well controlled. Encouraged heart healthy diet such as the DASH diet and exercise as tolerated.    Obese Encouraged DASH diet, decrease po intake and increase exercise as tolerated. Needs 7-8 hours of sleep nightly. Avoid trans fats, eat small, frequent meals every 4-5 hours with lean proteins, complex carbs and healthy fats. Minimize simple carbs, GMO foods.   Preventative health care Patient declines referral for colonoscopy but agrees to cologuard.

## 2015-04-03 NOTE — Assessment & Plan Note (Signed)
Well controlled. Encouraged heart healthy diet such as the DASH diet and exercise as tolerated.  

## 2015-04-03 NOTE — Assessment & Plan Note (Signed)
Patient declines referral for colonoscopy but agrees to cologuard.

## 2015-04-13 ENCOUNTER — Telehealth: Payer: Self-pay | Admitting: *Deleted

## 2015-04-13 NOTE — Telephone Encounter (Signed)
Prior authorization for Jublia approved effective 03/14/2015 through 04/12/2016. [id number]

## 2015-05-10 LAB — COLOGUARD

## 2015-05-19 ENCOUNTER — Encounter: Payer: Self-pay | Admitting: Family Medicine

## 2015-05-19 LAB — COLOGUARD: Cologuard: NEGATIVE

## 2015-05-19 NOTE — Telephone Encounter (Signed)
Received results from [employer].  Results were negative.  Called the patient left message to call back.   Copied results for scan, mail to patient and to put in folder.

## 2015-05-19 NOTE — Telephone Encounter (Signed)
Also printed a letter and mailed a copy.

## 2015-06-07 ENCOUNTER — Encounter: Payer: Self-pay | Admitting: Family Medicine

## 2015-08-05 ENCOUNTER — Other Ambulatory Visit: Payer: Self-pay | Admitting: Family Medicine

## 2015-08-29 ENCOUNTER — Other Ambulatory Visit (INDEPENDENT_AMBULATORY_CARE_PROVIDER_SITE_OTHER): Payer: Managed Care, Other (non HMO)

## 2015-08-29 DIAGNOSIS — B351 Tinea unguium: Secondary | ICD-10-CM | POA: Diagnosis not present

## 2015-08-29 DIAGNOSIS — K219 Gastro-esophageal reflux disease without esophagitis: Secondary | ICD-10-CM

## 2015-08-29 DIAGNOSIS — F418 Other specified anxiety disorders: Secondary | ICD-10-CM

## 2015-08-29 DIAGNOSIS — Z0000 Encounter for general adult medical examination without abnormal findings: Secondary | ICD-10-CM

## 2015-08-29 DIAGNOSIS — F419 Anxiety disorder, unspecified: Secondary | ICD-10-CM

## 2015-08-29 DIAGNOSIS — E782 Mixed hyperlipidemia: Secondary | ICD-10-CM

## 2015-08-29 DIAGNOSIS — IMO0001 Reserved for inherently not codable concepts without codable children: Secondary | ICD-10-CM

## 2015-08-29 DIAGNOSIS — R739 Hyperglycemia, unspecified: Secondary | ICD-10-CM

## 2015-08-29 DIAGNOSIS — F329 Major depressive disorder, single episode, unspecified: Secondary | ICD-10-CM

## 2015-08-29 DIAGNOSIS — R030 Elevated blood-pressure reading, without diagnosis of hypertension: Secondary | ICD-10-CM

## 2015-08-29 DIAGNOSIS — F32A Depression, unspecified: Secondary | ICD-10-CM

## 2015-08-29 LAB — CBC
HCT: 44.7 % (ref 39.0–52.0)
HEMOGLOBIN: 15.3 g/dL (ref 13.0–17.0)
MCHC: 34.3 g/dL (ref 30.0–36.0)
MCV: 93 fl (ref 78.0–100.0)
PLATELETS: 172 10*3/uL (ref 150.0–400.0)
RBC: 4.81 Mil/uL (ref 4.22–5.81)
RDW: 13.6 % (ref 11.5–15.5)
WBC: 5.1 10*3/uL (ref 4.0–10.5)

## 2015-08-29 LAB — LIPID PANEL
CHOLESTEROL: 210 mg/dL — AB (ref 0–200)
HDL: 41 mg/dL (ref >39.00)
LDL Cholesterol: 130 mg/dL — ABNORMAL HIGH (ref 0–99)
NonHDL: 169.2
Total CHOL/HDL Ratio: 5
Triglycerides: 196 mg/dL — ABNORMAL HIGH (ref 0.0–149.0)
VLDL: 39.2 mg/dL (ref 0.0–40.0)

## 2015-08-29 LAB — COMPREHENSIVE METABOLIC PANEL
ALBUMIN: 4 g/dL (ref 3.5–5.2)
ALK PHOS: 63 U/L (ref 39–117)
ALT: 14 U/L (ref 0–53)
AST: 18 U/L (ref 0–37)
BUN: 17 mg/dL (ref 6–23)
CHLORIDE: 107 meq/L (ref 96–112)
CO2: 30 mEq/L (ref 19–32)
Calcium: 9.3 mg/dL (ref 8.4–10.5)
Creatinine, Ser: 1.25 mg/dL (ref 0.40–1.50)
GFR: 62.34 mL/min (ref >60.00)
GLUCOSE: 123 mg/dL — AB (ref 70–99)
POTASSIUM: 4 meq/L (ref 3.5–5.1)
Sodium: 144 mEq/L (ref 135–145)
TOTAL PROTEIN: 7 g/dL (ref 6.0–8.3)
Total Bilirubin: 0.9 mg/dL (ref 0.2–1.2)

## 2015-08-29 LAB — TSH: TSH: 5.22 u[IU]/mL — ABNORMAL HIGH (ref 0.35–4.50)

## 2015-08-30 ENCOUNTER — Other Ambulatory Visit (INDEPENDENT_AMBULATORY_CARE_PROVIDER_SITE_OTHER): Payer: Managed Care, Other (non HMO)

## 2015-08-30 DIAGNOSIS — R946 Abnormal results of thyroid function studies: Secondary | ICD-10-CM | POA: Diagnosis not present

## 2015-08-30 DIAGNOSIS — R739 Hyperglycemia, unspecified: Secondary | ICD-10-CM

## 2015-08-30 LAB — T4, FREE: FREE T4: 0.85 ng/dL (ref 0.60–1.60)

## 2015-08-30 LAB — HEMOGLOBIN A1C: HEMOGLOBIN A1C: 5.2 % (ref 4.6–6.5)

## 2015-09-01 ENCOUNTER — Ambulatory Visit (INDEPENDENT_AMBULATORY_CARE_PROVIDER_SITE_OTHER): Payer: Managed Care, Other (non HMO) | Admitting: Family Medicine

## 2015-09-01 ENCOUNTER — Encounter: Payer: Self-pay | Admitting: Family Medicine

## 2015-09-01 VITALS — BP 120/72 | HR 85 | Temp 98.5°F | Ht 75.0 in | Wt 245.1 lb

## 2015-09-01 DIAGNOSIS — N529 Male erectile dysfunction, unspecified: Secondary | ICD-10-CM | POA: Diagnosis not present

## 2015-09-01 DIAGNOSIS — B351 Tinea unguium: Secondary | ICD-10-CM

## 2015-09-01 DIAGNOSIS — K219 Gastro-esophageal reflux disease without esophagitis: Secondary | ICD-10-CM | POA: Diagnosis not present

## 2015-09-01 DIAGNOSIS — F419 Anxiety disorder, unspecified: Secondary | ICD-10-CM

## 2015-09-01 DIAGNOSIS — E669 Obesity, unspecified: Secondary | ICD-10-CM

## 2015-09-01 DIAGNOSIS — Z23 Encounter for immunization: Secondary | ICD-10-CM | POA: Diagnosis not present

## 2015-09-01 DIAGNOSIS — F329 Major depressive disorder, single episode, unspecified: Secondary | ICD-10-CM

## 2015-09-01 DIAGNOSIS — R030 Elevated blood-pressure reading, without diagnosis of hypertension: Secondary | ICD-10-CM

## 2015-09-01 DIAGNOSIS — E782 Mixed hyperlipidemia: Secondary | ICD-10-CM

## 2015-09-01 DIAGNOSIS — F418 Other specified anxiety disorders: Secondary | ICD-10-CM | POA: Diagnosis not present

## 2015-09-01 DIAGNOSIS — IMO0001 Reserved for inherently not codable concepts without codable children: Secondary | ICD-10-CM

## 2015-09-01 DIAGNOSIS — E7800 Pure hypercholesterolemia, unspecified: Secondary | ICD-10-CM

## 2015-09-01 DIAGNOSIS — F32A Depression, unspecified: Secondary | ICD-10-CM

## 2015-09-01 DIAGNOSIS — R739 Hyperglycemia, unspecified: Secondary | ICD-10-CM

## 2015-09-01 DIAGNOSIS — Z0000 Encounter for general adult medical examination without abnormal findings: Secondary | ICD-10-CM

## 2015-09-01 MED ORDER — EFINACONAZOLE 10 % EX SOLN: 1.0000 "application " | Freq: Every day | CUTANEOUS | Status: DC

## 2015-09-01 MED ORDER — RANITIDINE HCL 300 MG PO TABS
300.0000 mg | ORAL_TABLET | Freq: Every day | ORAL | Status: DC
Start: 2015-09-01 — End: 2016-02-21

## 2015-09-01 MED ORDER — ESCITALOPRAM OXALATE 5 MG PO TABS
5.0000 mg | ORAL_TABLET | Freq: Every day | ORAL | Status: DC
Start: 2015-09-01 — End: 2016-04-06

## 2015-09-01 MED ORDER — ACYCLOVIR 800 MG PO TABS: 800.0000 mg | ORAL_TABLET | Freq: Three times a day (TID) | ORAL | Status: DC

## 2015-09-01 MED ORDER — CELECOXIB 200 MG PO CAPS: ORAL_CAPSULE | ORAL | Status: DC

## 2015-09-01 MED ORDER — ALPRAZOLAM 0.25 MG PO TABS: 0.2500 mg | ORAL_TABLET | Freq: Every day | ORAL | Status: DC | PRN

## 2015-09-01 MED ORDER — TADALAFIL 5 MG PO TABS: 5.0000 mg | ORAL_TABLET | Freq: Every day | ORAL | Status: DC | PRN

## 2015-09-01 NOTE — Patient Instructions (Signed)
DASH Eating Plan °DASH stands for "Dietary Approaches to Stop Hypertension." The DASH eating plan is a healthy eating plan that has been shown to reduce high blood pressure (hypertension). Additional health benefits may include reducing the risk of type 2 diabetes mellitus, heart disease, and stroke. The DASH eating plan may also help with weight loss. °WHAT DO I NEED TO KNOW ABOUT THE DASH EATING PLAN? °For the DASH eating plan, you will follow these general guidelines: °· Choose foods with a percent daily value for sodium of less than 5% (as listed on the food label). °· Use salt-free seasonings or herbs instead of table salt or sea salt. °· Check with your health care provider or pharmacist before using salt substitutes. °· Eat lower-sodium products, often labeled as "lower sodium" or "no salt added." °· Eat fresh foods. °· Eat more vegetables, fruits, and low-fat dairy products. °· Choose whole grains. Look for the word "whole" as the first word in the ingredient list. °· Choose fish and skinless chicken or turkey more often than red meat. Limit fish, poultry, and meat to 6 oz (170 g) each day. °· Limit sweets, desserts, sugars, and sugary drinks. °· Choose heart-healthy fats. °· Limit cheese to 1 oz (28 g) per day. °· Eat more home-cooked food and less restaurant, buffet, and fast food. °· Limit fried foods. °· Cook foods using methods other than frying. °· Limit canned vegetables. If you do use them, rinse them well to decrease the sodium. °· When eating at a restaurant, ask that your food be prepared with less salt, or no salt if possible. °WHAT FOODS CAN I EAT? °Seek help from a dietitian for individual calorie needs. °Grains °Whole grain or whole wheat bread. Brown rice. Whole grain or whole wheat pasta. Quinoa, bulgur, and whole grain cereals. Low-sodium cereals. Corn or whole wheat flour tortillas. Whole grain cornbread. Whole grain crackers. Low-sodium crackers. °Vegetables °Fresh or frozen vegetables  (raw, steamed, roasted, or grilled). Low-sodium or reduced-sodium tomato and vegetable juices. Low-sodium or reduced-sodium tomato sauce and paste. Low-sodium or reduced-sodium canned vegetables.  °Fruits °All fresh, canned (in natural juice), or frozen fruits. °Meat and Other Protein Products °Ground beef (85% or leaner), grass-fed beef, or beef trimmed of fat. Skinless chicken or turkey. Ground chicken or turkey. Pork trimmed of fat. All fish and seafood. Eggs. Dried beans, peas, or lentils. Unsalted nuts and seeds. Unsalted canned beans. °Dairy °Low-fat dairy products, such as skim or 1% milk, 2% or reduced-fat cheeses, low-fat ricotta or cottage cheese, or plain low-fat yogurt. Low-sodium or reduced-sodium cheeses. °Fats and Oils °Tub margarines without trans fats. Light or reduced-fat mayonnaise and salad dressings (reduced sodium). Avocado. Safflower, olive, or canola oils. Natural peanut or almond butter. °Other °Unsalted popcorn and pretzels. °The items listed above may not be a complete list of recommended foods or beverages. Contact your dietitian for more options. °WHAT FOODS ARE NOT RECOMMENDED? °Grains °White bread. White pasta. White rice. Refined cornbread. Bagels and croissants. Crackers that contain trans fat. °Vegetables °Creamed or fried vegetables. Vegetables in a cheese sauce. Regular canned vegetables. Regular canned tomato sauce and paste. Regular tomato and vegetable juices. °Fruits °Dried fruits. Canned fruit in light or heavy syrup. Fruit juice. °Meat and Other Protein Products °Fatty cuts of meat. Ribs, chicken wings, bacon, sausage, bologna, salami, chitterlings, fatback, hot dogs, bratwurst, and packaged luncheon meats. Salted nuts and seeds. Canned beans with salt. °Dairy °Whole or 2% milk, cream, half-and-half, and cream cheese. Whole-fat or sweetened yogurt. Full-fat   cheeses or blue cheese. Nondairy creamers and whipped toppings. Processed cheese, cheese spreads, or cheese  curds. °Condiments °Onion and garlic salt, seasoned salt, table salt, and sea salt. Canned and packaged gravies. Worcestershire sauce. Tartar sauce. Barbecue sauce. Teriyaki sauce. Soy sauce, including reduced sodium. Steak sauce. Fish sauce. Oyster sauce. Cocktail sauce. Horseradish. Ketchup and mustard. Meat flavorings and tenderizers. Bouillon cubes. Hot sauce. Tabasco sauce. Marinades. Taco seasonings. Relishes. °Fats and Oils °Butter, stick margarine, lard, shortening, ghee, and bacon fat. Coconut, palm kernel, or palm oils. Regular salad dressings. °Other °Pickles and olives. Salted popcorn and pretzels. °The items listed above may not be a complete list of foods and beverages to avoid. Contact your dietitian for more information. °WHERE CAN I FIND MORE INFORMATION? °National [employer], [employer], [employer]: www.nhlbi.nih.gov/health/health-topics/topics/dash/ °Document Released: 11/15/2011 Document Revised: 04/12/2014 Document Reviewed: 09/30/2013 °ExitCare® Patient Information ©2015 [employer]. This information is not intended to replace advice given to you by your health care provider. Make sure you discuss any questions you have with your health care provider. ° °

## 2015-09-01 NOTE — Progress Notes (Signed)
Pre visit review using our clinic review tool, if applicable. No additional management support is needed unless otherwise documented below in the visit note. 

## 2015-09-09 ENCOUNTER — Encounter: Payer: Self-pay | Admitting: Family Medicine

## 2015-09-09 NOTE — Assessment & Plan Note (Signed)
hgba1c acceptable, minimize simple carbs. Increase exercise as tolerated. Continue current meds 

## 2015-09-09 NOTE — Assessment & Plan Note (Signed)
Well controlled. Encouraged heart healthy diet such as the DASH diet and exercise as tolerated.  

## 2015-09-09 NOTE — Assessment & Plan Note (Signed)
Encouraged heart healthy diet, increase exercise, avoid trans fats, consider a krill oil cap daily 

## 2015-09-09 NOTE — Progress Notes (Signed)
Subjective:    Patient ID: Gerald Booth, male    DOB: 1956/02/23, 59 y.o.   MRN: [id number]  Chief Complaint  Patient presents with  . Follow-up    HPI Patient is in today for follow-up. Is feeling fairly well. No recent illness. Has been trying to decrease simple carbohydrates. Is trying to stay active. No recent illness. No acute concerns. Denies CP/palp/SOB/HA/congestion/fevers/GI or GU c/o. Taking meds as prescribed  Past Medical History  Diagnosis Date  . Elevated BP 10/03/2012  . Obese 10/03/2012  . Chicken pox as a child  . Measles as a child  . Mumps as a child  . Kidney stones 10/10/2012  . Hearing loss 10/10/2012  . Onychomycosis 10/10/2012  . ED (erectile dysfunction) 10/10/2012  . 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 Preventative health care 10/10/2012  . Anxiety and depression 01/27/2013  . Depression with anxiety 01/27/2013    Past Surgical History  Procedure Laterality Date  . Wisdom tooth extraction  2012    1 tooth    Family History  Problem Relation Age of Onset  . Cancer Mother     breast, bone  . Hyperlipidemia Father   . Other Father     prostate surgery  . Heart attack Maternal Grandmother   . Kidney disease Maternal Grandfather     kidney failure    Social History   Social History  . Marital Status: Married    Spouse Name: N/A  . Number of Children: N/A  . Years of Education: N/A   Occupational History  . Not on file.   Social History Main Topics  . Smoking status: Former Smoker    Types: Cigarettes    Start date: 12/11/1991  . Smokeless tobacco: Never Used     Comment: smoked on weekends  . Alcohol Use: Yes     Comment: 1 a day  . Drug Use: No  . Sexual Activity: Yes   Other Topics Concern  . Not on file   Social History Narrative    Outpatient Prescriptions Prior to Visit  Medication Sig Dispense Refill  . aspirin EC 81 MG tablet Take 1 tablet (81 mg total) by mouth daily.    Marland Kitchen acyclovir (ZOVIRAX) 800 MG tablet TAKE 1  TABLET BY MOUTH 3 TIMES A DAY 90 tablet 0  . ALPRAZolam (XANAX) 0.25 MG tablet Take 1 tablet (0.25 mg total) by mouth daily as needed for anxiety or sleep. 30 tablet 3  . celecoxib (CELEBREX) 200 MG capsule TAKE ONE CAPSULE BY MOUTH TWICE A DAY AT BEDTIME 180 capsule 3  . Efinaconazole (JUBLIA) 10 % SOLN Apply 1 application topically daily. 8 mL 1  . escitalopram (LEXAPRO) 5 MG tablet Take 1 tablet (5 mg total) by mouth daily. 90 tablet 3  . ranitidine (ZANTAC) 300 MG tablet Take 1 tablet (300 mg total) by mouth at bedtime. 90 tablet 3  . tadalafil (CIALIS) 5 MG tablet Take 1 tablet (5 mg total) by mouth daily as needed for erectile dysfunction. 30 tablet 5   No facility-administered medications prior to visit.    Allergies  Allergen Reactions  . Penicillins     Nose bleeds    Review of Systems  Constitutional: Negative for fever and malaise/fatigue.  HENT: Negative for congestion.   Eyes: Negative for discharge.  Respiratory: Negative for shortness of breath.   Cardiovascular: Negative for chest pain, palpitations and leg swelling.  Gastrointestinal: Negative for nausea and abdominal pain.  Genitourinary: Negative for dysuria.  Musculoskeletal: Negative for falls.  Skin: Negative for rash.  Neurological: Negative for loss of consciousness and headaches.  Endo/Heme/Allergies: Negative for environmental allergies.  Psychiatric/Behavioral: Negative for depression. The patient is not nervous/anxious.        Objective:    Physical Exam  Constitutional: He is oriented to person, place, and time. He appears well-developed and well-nourished. No distress.  HENT:  Head: Normocephalic and atraumatic.  Nose: Nose normal.  Eyes: Right eye exhibits no discharge. Left eye exhibits no discharge.  Neck: Normal range of motion. Neck supple.  Cardiovascular: Normal rate and regular rhythm.   No murmur heard. Pulmonary/Chest: Effort normal and breath sounds normal.  Abdominal: Soft. Bowel  sounds are normal. There is no tenderness.  Musculoskeletal: He exhibits no edema.  Neurological: He is alert and oriented to person, place, and time.  Skin: Skin is warm and dry.  Psychiatric: He has a normal mood and affect.  Nursing note and vitals reviewed.   BP 120/72 mmHg  Pulse 85  Temp(Src) 98.5 F (36.9 C) (Oral)  Ht 6\' 3"  (1.905 m)  Wt 245 lb 2 oz (111.188 kg)  BMI 30.64 kg/m2  SpO2 95% Wt Readings from Last 3 Encounters:  09/01/15 245 lb 2 oz (111.188 kg)  03/29/15 243 lb (110.224 kg)  06/18/14 251 lb (113.853 kg)     Lab Results  Component Value Date   WBC 5.1 08/29/2015   HGB 15.3 08/29/2015   HCT 44.7 08/29/2015   PLT 172.0 08/29/2015   GLUCOSE 123* 08/29/2015   CHOL 210* 08/29/2015   TRIG 196.0* 08/29/2015   HDL 41.00 08/29/2015   LDLDIRECT 157.9 10/03/2012   LDLCALC 130* 08/29/2015   ALT 14 08/29/2015   AST 18 08/29/2015   NA 144 08/29/2015   K 4.0 08/29/2015   CL 107 08/29/2015   CREATININE 1.25 08/29/2015   BUN 17 08/29/2015   CO2 30 08/29/2015   TSH 5.22* 08/29/2015   PSA 0.57 06/18/2014   HGBA1C 5.2 08/30/2015    Lab Results  Component Value Date   TSH 5.22* 08/29/2015   Lab Results  Component Value Date   WBC 5.1 08/29/2015   HGB 15.3 08/29/2015   HCT 44.7 08/29/2015   MCV 93.0 08/29/2015   PLT 172.0 08/29/2015   Lab Results  Component Value Date   NA 144 08/29/2015   K 4.0 08/29/2015   CO2 30 08/29/2015   GLUCOSE 123* 08/29/2015   BUN 17 08/29/2015   CREATININE 1.25 08/29/2015   BILITOT 0.9 08/29/2015   ALKPHOS 63 08/29/2015   AST 18 08/29/2015   ALT 14 08/29/2015   PROT 7.0 08/29/2015   ALBUMIN 4.0 08/29/2015   CALCIUM 9.3 08/29/2015   GFR 62.34 08/29/2015   Lab Results  Component Value Date   CHOL 210* 08/29/2015   Lab Results  Component Value Date   HDL 41.00 08/29/2015   Lab Results  Component Value Date   LDLCALC 130* 08/29/2015   Lab Results  Component Value Date   TRIG 196.0* 08/29/2015   Lab  Results  Component Value Date   CHOLHDL 5 08/29/2015   Lab Results  Component Value Date   HGBA1C 5.2 08/30/2015       Assessment & Plan:   Problem List Items Addressed This Visit    Preventative health care   Relevant Medications   Efinaconazole (JUBLIA) 10 % SOLN   Other Relevant Orders   Hepatitis C antibody   PSA   HIV antibody (with reflex)  Onychomycosis   Relevant Medications   acyclovir (ZOVIRAX) 800 MG tablet   Efinaconazole (JUBLIA) 10 % SOLN   Obese    Encouraged DASH diet, decrease po intake and increase exercise as tolerated. Needs 7-8 hours of sleep nightly. Avoid trans fats, eat small, frequent meals every 4-5 hours with lean proteins, complex carbs and healthy fats. Minimize simple carbs, GMO foods.      Hyperglycemia    hgba1c acceptable, minimize simple carbs. Increase exercise as tolerated. Continue current meds      Relevant Medications   Efinaconazole (JUBLIA) 10 % SOLN   Other Relevant Orders   Hemoglobin A1c   HYPERCHOLESTEROLEMIA    Encouraged heart healthy diet, increase exercise, avoid trans fats, consider a krill oil cap daily      Relevant Medications   tadalafil (CIALIS) 5 MG tablet   Elevated BP    Well controlled. Encouraged heart healthy diet such as the DASH diet and exercise as tolerated.       Relevant Medications   Efinaconazole (JUBLIA) 10 % SOLN   Other Relevant Orders   CBC   TSH   Comprehensive metabolic panel   ED (erectile dysfunction) - Primary   Relevant Medications   tadalafil (CIALIS) 5 MG tablet   Depression with anxiety   Relevant Medications   escitalopram (LEXAPRO) 5 MG tablet   Efinaconazole (JUBLIA) 10 % SOLN    Other Visit Diagnoses    Reflux        Relevant Medications    ranitidine (ZANTAC) 300 MG tablet    Efinaconazole (JUBLIA) 10 % SOLN    Anxiety and depression        Relevant Medications    Efinaconazole (JUBLIA) 10 % SOLN    ALPRAZolam (XANAX) 0.25 MG tablet    Hyperlipidemia, mixed         Relevant Medications    tadalafil (CIALIS) 5 MG tablet    Efinaconazole (JUBLIA) 10 % SOLN    Other Relevant Orders    Lipid panel    Encounter for immunization           I have changed Mr. Pizzo acyclovir. I am also having him maintain his aspirin EC, tadalafil, ranitidine, escitalopram, celecoxib, Efinaconazole, and ALPRAZolam.  Meds ordered this encounter  Medications  . tadalafil (CIALIS) 5 MG tablet    Sig: Take 1 tablet (5 mg total) by mouth daily as needed for erectile dysfunction.    Dispense:  30 tablet    Refill:  6  . ranitidine (ZANTAC) 300 MG tablet    Sig: Take 1 tablet (300 mg total) by mouth at bedtime.    Dispense:  90 tablet    Refill:  3  . escitalopram (LEXAPRO) 5 MG tablet    Sig: Take 1 tablet (5 mg total) by mouth daily.    Dispense:  90 tablet    Refill:  3  . celecoxib (CELEBREX) 200 MG capsule    Sig: TAKE ONE CAPSULE BY MOUTH TWICE A DAY AT BEDTIME    Dispense:  180 capsule    Refill:  3  . acyclovir (ZOVIRAX) 800 MG tablet    Sig: Take 1 tablet (800 mg total) by mouth 3 (three) times daily.    Dispense:  90 tablet    Refill:  1  . Efinaconazole (JUBLIA) 10 % SOLN    Sig: Apply 1 application topically daily.    Dispense:  8 mL    Refill:  1  . ALPRAZolam (XANAX) 0.25  MG tablet    Sig: Take 1 tablet (0.25 mg total) by mouth daily as needed for anxiety or sleep.    Dispense:  30 tablet    Refill:  3     Penni Homans, MD

## 2015-09-09 NOTE — Assessment & Plan Note (Signed)
Encouraged DASH diet, decrease po intake and increase exercise as tolerated. Needs 7-8 hours of sleep nightly. Avoid trans fats, eat small, frequent meals every 4-5 hours with lean proteins, complex carbs and healthy fats. Minimize simple carbs, GMO foods. 

## 2015-11-14 ENCOUNTER — Emergency Department (HOSPITAL_BASED_OUTPATIENT_CLINIC_OR_DEPARTMENT_OTHER)
Admission: EM | Admit: 2015-11-14 | Discharge: 2015-11-14 | Disposition: A | Discharge status: Home / Self Care | Payer: Managed Care, Other (non HMO) | Attending: Emergency Medicine | Admitting: Emergency Medicine

## 2015-11-14 ENCOUNTER — Encounter (HOSPITAL_BASED_OUTPATIENT_CLINIC_OR_DEPARTMENT_OTHER): Payer: Self-pay | Admitting: *Deleted

## 2015-11-14 ENCOUNTER — Emergency Department (HOSPITAL_BASED_OUTPATIENT_CLINIC_OR_DEPARTMENT_OTHER): Payer: Managed Care, Other (non HMO)

## 2015-11-14 DIAGNOSIS — H9190 Unspecified hearing loss, unspecified ear: Secondary | ICD-10-CM | POA: Diagnosis not present

## 2015-11-14 DIAGNOSIS — Z79899 Other long term (current) drug therapy: Secondary | ICD-10-CM | POA: Insufficient documentation

## 2015-11-14 DIAGNOSIS — E669 Obesity, unspecified: Secondary | ICD-10-CM | POA: Insufficient documentation

## 2015-11-14 DIAGNOSIS — N529 Male erectile dysfunction, unspecified: Secondary | ICD-10-CM | POA: Insufficient documentation

## 2015-11-14 DIAGNOSIS — Z87442 Personal history of urinary calculi: Secondary | ICD-10-CM | POA: Diagnosis not present

## 2015-11-14 DIAGNOSIS — F418 Other specified anxiety disorders: Secondary | ICD-10-CM | POA: Diagnosis not present

## 2015-11-14 DIAGNOSIS — Z880 Allergy status to penicillin: Secondary | ICD-10-CM | POA: Insufficient documentation

## 2015-11-14 DIAGNOSIS — Z7982 Long term (current) use of aspirin: Secondary | ICD-10-CM | POA: Insufficient documentation

## 2015-11-14 DIAGNOSIS — M545 Low back pain: Secondary | ICD-10-CM | POA: Diagnosis not present

## 2015-11-14 DIAGNOSIS — Z8619 Personal history of other infectious and parasitic diseases: Secondary | ICD-10-CM | POA: Insufficient documentation

## 2015-11-14 DIAGNOSIS — Z87891 Personal history of nicotine dependence: Secondary | ICD-10-CM | POA: Insufficient documentation

## 2015-11-14 DIAGNOSIS — R109 Unspecified abdominal pain: Secondary | ICD-10-CM | POA: Diagnosis not present

## 2015-11-14 LAB — URINALYSIS, ROUTINE W REFLEX MICROSCOPIC
BILIRUBIN URINE: NEGATIVE
GLUCOSE, UA: NEGATIVE mg/dL
HGB URINE DIPSTICK: NEGATIVE
KETONES UR: 15 mg/dL — AB
Leukocytes, UA: NEGATIVE
NITRITE: NEGATIVE
PH: 5.5 (ref 5.0–8.0)
Protein, ur: NEGATIVE mg/dL
SPECIFIC GRAVITY, URINE: 1.03 (ref 1.005–1.030)

## 2015-11-14 MED ORDER — ACETAMINOPHEN-CODEINE #3 300-30 MG PO TABS: 1.0000 | ORAL_TABLET | Freq: Four times a day (QID) | ORAL | Status: DC | PRN

## 2015-11-14 NOTE — ED Provider Notes (Signed)
CSN: [id number]     Arrival date & time 11/14/15  1133 History   First MD Initiated Contact with Patient 11/14/15 1154     Chief Complaint  Patient presents with  . Back Pain     (Consider location/radiation/quality/duration/timing/severity/associated sxs/prior Treatment) HPI   59 year old obese male with hx of kidney stone presents with R flank pain.  Pt report his brother recently passed away 3 weeks ago and he has been feeling depressed.  He attempted to distract himself by doing yard work.  He's unsure if he may have pulled a muscle in the process but for the past 1 week he has notice pain to his R flank that is waxing and waning, seems to improve with walking and occasionally with ibuprofen.  Report occasional bouts of nausea.  He also recall having hx of remote kidney stone and unsure if this is due to kidney stone. Pain is currently 4/10 and non radiating.  No fever, chills, headache, lightheadedness, dizziness, cp, sob, decrease in appetite, dysuria, hematuria, hematochezia or melena.  I offer pain medication, pt declined.    Past Medical History  Diagnosis Date  . Elevated BP 10/03/2012  . Obese 10/03/2012  . Chicken pox as a child  . Measles as a child  . Mumps as a child  . Kidney stones 10/10/2012  . Hearing loss 10/10/2012  . Onychomycosis 10/10/2012  . ED (erectile dysfunction) 10/10/2012  . 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 Preventative health care 10/10/2012  . Anxiety and depression 01/27/2013  . Depression with anxiety 01/27/2013   Past Surgical History  Procedure Laterality Date  . Wisdom tooth extraction  2012    1 tooth   Family History  Problem Relation Age of Onset  . Cancer Mother     breast, bone  . Hyperlipidemia Father   . Other Father     prostate surgery  . Heart attack Maternal Grandmother   . Kidney disease Maternal Grandfather     kidney failure   Social History  Substance Use Topics  . Smoking status: Former Smoker    Types: Cigarettes    Start  date: 12/11/1991  . Smokeless tobacco: Never Used     Comment: smoked on weekends  . Alcohol Use: Yes     Comment: 1 a day    Review of Systems  All other systems reviewed and are negative.     Allergies  Penicillins  Home Medications   Prior to Admission medications   Medication Sig Start Date End Date Taking? Authorizing Provider  acyclovir (ZOVIRAX) 800 MG tablet Take 1 tablet (800 mg total) by mouth 3 (three) times daily. 09/01/15   Mosie Lukes, MD  ALPRAZolam Duanne Moron) 0.25 MG tablet Take 1 tablet (0.25 mg total) by mouth daily as needed for anxiety or sleep. 09/01/15   Mosie Lukes, MD  aspirin EC 81 MG tablet Take 1 tablet (81 mg total) by mouth daily. 10/10/12   Mosie Lukes, MD  celecoxib (CELEBREX) 200 MG capsule TAKE ONE CAPSULE BY MOUTH TWICE A DAY AT BEDTIME 09/01/15   Mosie Lukes, MD  Efinaconazole (JUBLIA) 10 % SOLN Apply 1 application topically daily. 09/01/15   Mosie Lukes, MD  escitalopram (LEXAPRO) 5 MG tablet Take 1 tablet (5 mg total) by mouth daily. 09/01/15   Mosie Lukes, MD  ranitidine (ZANTAC) 300 MG tablet Take 1 tablet (300 mg total) by mouth at bedtime. 09/01/15   Mosie Lukes, MD  tadalafil (  CIALIS) 5 MG tablet Take 1 tablet (5 mg total) by mouth daily as needed for erectile dysfunction. 09/01/15   Mosie Lukes, MD   BP 118/86 mmHg  Pulse 77  Temp(Src)   Resp 18  Ht 6\' 3"  (1.905 m)  Wt 106.595 kg  BMI 29.37 kg/m2  SpO2 94% Physical Exam  Constitutional: He appears well-developed and well-nourished. No distress.  HENT:  Head: Atraumatic.  Eyes: Conjunctivae are normal.  Neck: Neck supple.  Cardiovascular: Normal rate, regular rhythm and intact distal pulses.   Pulmonary/Chest: Effort normal and breath sounds normal.  Abdominal: Soft. There is no tenderness.  Genitourinary:  No cva tenderness  Neurological: He is alert.  Skin: No rash noted.  Psychiatric: He has a normal mood and affect.  Nursing note and vitals reviewed.   ED  Course  Procedures (including critical care time) Labs Review Labs Reviewed  URINALYSIS, ROUTINE W REFLEX MICROSCOPIC (NOT AT [facility])    Imaging Review No results found. I have personally reviewed and evaluated these images and lab results as part of my medical decision-making.   EKG Interpretation None      MDM   Final diagnoses:  Right flank pain    BP 116/75 mmHg  Pulse 59  Temp(Src) 98 F (36.7 C) (Oral)  Resp 18  Ht 6\' 3"  (1.905 m)  Wt 106.595 kg  BMI 29.37 kg/m2  SpO2 94%   Recurrently R flank pain, hx of kidney stone.  No reproducible pain on exam.  Bedside US performed by me to assess kidneys  EMERGENCY DEPARTMENT US RENAL EXAM  "Study: Limited Retroperitoneal Ultrasound of Kidneys"  INDICATIONS: Flank pain  Long and short axis of both kidneys were obtained.   PERFORMED BY: Myself  IMAGES ARCHIVED?: Yes  LIMITATIONS: Body habitus  VIEWS USED: Long axis and Short axis   INTERPRETATION: No Hydronephrosis, No Renal cyst, No Kidney stone   CPT Code: [id number] (limited retroperitoneal)  1:52 PM UA unremarkable.  CT renal stone showing no acute finding.  No evidence of aneurysm as well.  Pt currently without any significant discomfort, resting comfortably VSS.  I recommend f/u with PCP for further care. Return precaution discussed.    Domenic Moras, PA-C 11/14/15 1407  Blanchie Dessert, MD 11/14/15 1420

## 2015-11-14 NOTE — Discharge Instructions (Signed)
Flank Pain °Flank pain refers to pain that is located on the side of the body between the upper abdomen and the back. The pain may occur over a short period of time (acute) or may be long-term or reoccurring (chronic). It may be mild or severe. Flank pain can be caused by many things. °CAUSES  °Some of the more common causes of flank pain include: °· Muscle strains.   °· Muscle spasms.   °· A disease of your spine (vertebral disk disease).   °· A lung infection (pneumonia).   °· Fluid around your lungs (pulmonary edema).   °· A kidney infection.   °· Kidney stones.   °· A very painful skin rash caused by the chickenpox virus (shingles).   °· Gallbladder disease.   °HOME CARE INSTRUCTIONS  °Home care will depend on the cause of your pain. In general, °· Rest as directed by your caregiver. °· Drink enough fluids to keep your urine clear or pale yellow. °· Only take over-the-counter or prescription medicines as directed by your caregiver. Some medicines may help relieve the pain. °· Tell your caregiver about any changes in your pain. °· Follow up with your caregiver as directed. °SEEK IMMEDIATE MEDICAL CARE IF:  °· Your pain is not controlled with medicine.   °· You have new or worsening symptoms. °· Your pain increases.   °· You have abdominal pain.   °· You have shortness of breath.   °· You have persistent nausea or vomiting.   °· You have swelling in your abdomen.   °· You feel faint or pass out.   °· You have blood in your urine. °· You have a fever or persistent symptoms for more than 2-3 days. °· You have a fever and your symptoms suddenly get worse. °MAKE SURE YOU:  °· Understand these instructions. °· Will watch your condition. °· Will get help right away if you are not doing well or get worse. °  °This information is not intended to replace advice given to you by your health care provider. Make sure you discuss any questions you have with your health care provider. °  °Document Released: 01/17/2006 Document  Revised: 08/20/2012 Document Reviewed: 07/10/2012 °Elsevier Interactive Patient Education ©2016 [employer] ° °

## 2015-11-14 NOTE — ED Notes (Signed)
Right flank pain for a week. Hx of kidney stones. States this pain feels like a stone.

## 2016-02-21 ENCOUNTER — Other Ambulatory Visit: Payer: Self-pay | Admitting: Family Medicine

## 2016-02-29 ENCOUNTER — Other Ambulatory Visit (INDEPENDENT_AMBULATORY_CARE_PROVIDER_SITE_OTHER): Payer: Managed Care, Other (non HMO)

## 2016-02-29 DIAGNOSIS — R030 Elevated blood-pressure reading, without diagnosis of hypertension: Secondary | ICD-10-CM

## 2016-02-29 DIAGNOSIS — IMO0001 Reserved for inherently not codable concepts without codable children: Secondary | ICD-10-CM

## 2016-02-29 DIAGNOSIS — E782 Mixed hyperlipidemia: Secondary | ICD-10-CM

## 2016-02-29 DIAGNOSIS — R739 Hyperglycemia, unspecified: Secondary | ICD-10-CM

## 2016-02-29 DIAGNOSIS — R7989 Other specified abnormal findings of blood chemistry: Secondary | ICD-10-CM

## 2016-02-29 DIAGNOSIS — Z0000 Encounter for general adult medical examination without abnormal findings: Secondary | ICD-10-CM | POA: Diagnosis not present

## 2016-02-29 LAB — CBC
HEMATOCRIT: 42.8 % (ref 39.0–52.0)
HEMOGLOBIN: 14.8 g/dL (ref 13.0–17.0)
MCHC: 34.7 g/dL (ref 30.0–36.0)
MCV: 91.4 fl (ref 78.0–100.0)
PLATELETS: 180 10*3/uL (ref 150.0–400.0)
RBC: 4.68 Mil/uL (ref 4.22–5.81)
RDW: 13.3 % (ref 11.5–15.5)
WBC: 5.2 10*3/uL (ref 4.0–10.5)

## 2016-02-29 LAB — COMPREHENSIVE METABOLIC PANEL
ALBUMIN: 4.1 g/dL (ref 3.5–5.2)
ALK PHOS: 62 U/L (ref 39–117)
ALT: 11 U/L (ref 0–53)
AST: 15 U/L (ref 0–37)
BILIRUBIN TOTAL: 0.5 mg/dL (ref 0.2–1.2)
BUN: 14 mg/dL (ref 6–23)
CO2: 28 mEq/L (ref 19–32)
Calcium: 9.4 mg/dL (ref 8.4–10.5)
Chloride: 108 mEq/L (ref 96–112)
Creatinine, Ser: 1 mg/dL (ref 0.40–1.50)
GFR: 80.51 mL/min (ref >60.00)
GLUCOSE: 116 mg/dL — AB (ref 70–99)
Potassium: 4 mEq/L (ref 3.5–5.1)
Sodium: 142 mEq/L (ref 135–145)
TOTAL PROTEIN: 6.8 g/dL (ref 6.0–8.3)

## 2016-02-29 LAB — HEPATITIS C ANTIBODY: HCV Ab: NEGATIVE

## 2016-02-29 LAB — HIV ANTIBODY (ROUTINE TESTING W REFLEX): HIV 1&2 Ab, 4th Generation: NONREACTIVE

## 2016-02-29 LAB — LIPID PANEL
CHOL/HDL RATIO: 5
Cholesterol: 205 mg/dL — ABNORMAL HIGH (ref 0–200)
HDL: 40.6 mg/dL (ref >39.00)
NonHDL: 164.55
Triglycerides: 231 mg/dL — ABNORMAL HIGH (ref 0.0–149.0)
VLDL: 46.2 mg/dL — AB (ref 0.0–40.0)

## 2016-02-29 LAB — HEMOGLOBIN A1C: HEMOGLOBIN A1C: 5.3 % (ref 4.6–6.5)

## 2016-02-29 LAB — PSA: PSA: 0.79 ng/mL (ref 0.10–4.00)

## 2016-02-29 LAB — LDL CHOLESTEROL, DIRECT: LDL DIRECT: 125 mg/dL

## 2016-02-29 LAB — TSH: TSH: 4.17 u[IU]/mL (ref 0.35–4.50)

## 2016-03-02 ENCOUNTER — Encounter: Payer: Self-pay | Admitting: Behavioral Health

## 2016-03-02 ENCOUNTER — Telehealth: Payer: Self-pay | Admitting: Behavioral Health

## 2016-03-02 NOTE — Telephone Encounter (Signed)
Pre-Visit Call completed with patient and chart updated.   Pre-Visit Info documented in Specialty Comments under [employer].    

## 2016-03-05 ENCOUNTER — Telehealth: Payer: Self-pay | Admitting: Family Medicine

## 2016-03-05 ENCOUNTER — Encounter: Payer: Managed Care, Other (non HMO) | Admitting: Family Medicine

## 2016-03-05 NOTE — Telephone Encounter (Signed)
Patient had to [facility] physical appointment due to provider no available physical appointment until September. Patient scheduled for 04/06/2016 at 11:15 (hospital follow up) please advise if this is ok? (patient states he hopes Dr. Charlett Blake feels better)

## 2016-03-05 NOTE — Telephone Encounter (Signed)
Yes that is fine  Thanks   

## 2016-04-06 ENCOUNTER — Encounter: Payer: Self-pay | Admitting: Family Medicine

## 2016-04-06 ENCOUNTER — Ambulatory Visit (INDEPENDENT_AMBULATORY_CARE_PROVIDER_SITE_OTHER): Payer: Managed Care, Other (non HMO) | Admitting: Family Medicine

## 2016-04-06 VITALS — BP 124/72 | HR 84 | Temp 94.0°F | Ht 75.0 in | Wt 241.2 lb

## 2016-04-06 DIAGNOSIS — IMO0001 Reserved for inherently not codable concepts without codable children: Secondary | ICD-10-CM

## 2016-04-06 DIAGNOSIS — R739 Hyperglycemia, unspecified: Secondary | ICD-10-CM

## 2016-04-06 DIAGNOSIS — Z0000 Encounter for general adult medical examination without abnormal findings: Secondary | ICD-10-CM | POA: Diagnosis not present

## 2016-04-06 DIAGNOSIS — E7800 Pure hypercholesterolemia, unspecified: Secondary | ICD-10-CM

## 2016-04-06 DIAGNOSIS — C44300 Unspecified malignant neoplasm of skin of unspecified part of face: Secondary | ICD-10-CM

## 2016-04-06 DIAGNOSIS — K219 Gastro-esophageal reflux disease without esophagitis: Secondary | ICD-10-CM | POA: Diagnosis not present

## 2016-04-06 DIAGNOSIS — R030 Elevated blood-pressure reading, without diagnosis of hypertension: Secondary | ICD-10-CM

## 2016-04-06 DIAGNOSIS — F329 Major depressive disorder, single episode, unspecified: Secondary | ICD-10-CM

## 2016-04-06 DIAGNOSIS — E782 Mixed hyperlipidemia: Secondary | ICD-10-CM

## 2016-04-06 DIAGNOSIS — N529 Male erectile dysfunction, unspecified: Secondary | ICD-10-CM

## 2016-04-06 DIAGNOSIS — F32A Depression, unspecified: Secondary | ICD-10-CM

## 2016-04-06 DIAGNOSIS — B351 Tinea unguium: Secondary | ICD-10-CM

## 2016-04-06 DIAGNOSIS — F418 Other specified anxiety disorders: Secondary | ICD-10-CM

## 2016-04-06 DIAGNOSIS — F419 Anxiety disorder, unspecified: Secondary | ICD-10-CM

## 2016-04-06 HISTORY — DX: Unspecified malignant neoplasm of skin of unspecified part of face: C44.300

## 2016-04-06 MED ORDER — ESCITALOPRAM OXALATE 5 MG PO TABS
5.0000 mg | ORAL_TABLET | Freq: Every day | ORAL | Status: DC
Start: 2016-04-06 — End: 2016-10-08

## 2016-04-06 MED ORDER — ALPRAZOLAM 0.25 MG PO TABS: 0.2500 mg | ORAL_TABLET | Freq: Every day | ORAL | Status: DC | PRN

## 2016-04-06 MED ORDER — TADALAFIL 5 MG PO TABS: 5.0000 mg | ORAL_TABLET | Freq: Every day | ORAL | Status: DC | PRN

## 2016-04-06 MED ORDER — RANITIDINE HCL 300 MG PO TABS: ORAL_TABLET | ORAL | Status: DC

## 2016-04-06 MED ORDER — EFINACONAZOLE 10 % EX SOLN: 1.0000 "application " | Freq: Every day | CUTANEOUS | Status: DC

## 2016-04-06 MED ORDER — ACYCLOVIR 800 MG PO TABS: 800.0000 mg | ORAL_TABLET | Freq: Three times a day (TID) | ORAL | Status: DC

## 2016-04-06 MED ORDER — CELECOXIB 200 MG PO CAPS: ORAL_CAPSULE | ORAL | Status: DC

## 2016-04-06 NOTE — Assessment & Plan Note (Signed)
Encouraged DASH diet, decrease po intake and increase exercise as tolerated. Needs 7-8 hours of sleep nightly. Avoid trans fats, eat small, frequent meals every 4-5 hours with lean proteins, complex carbs and healthy fats. Minimize simple carbs, GMO foods. 

## 2016-04-06 NOTE — Assessment & Plan Note (Signed)
Patient encouraged to maintain heart healthy diet, regular exercise, adequate sleep. Consider daily probiotics. Take medications as prescribed. Given and reviewed copy of ACP documents from [place] Secretary of State and encouraged to complete and return 

## 2016-04-06 NOTE — Assessment & Plan Note (Signed)
Follows with Dr Tonia Brooms and has had a lesion removed from left side of nose by [facility] and is doing well

## 2016-04-06 NOTE — Assessment & Plan Note (Signed)
Encouraged heart healthy diet, increase exercise, avoid trans fats, has started on krill oil cap daily

## 2016-04-06 NOTE — Patient Instructions (Addendum)
Salonpas with lidocaine patch.  Apply some witch hazel as needed to skin and also add a benzoide peroxide ointment. 5 drops of hydrogen peroxide in ears after shower daily x 5 days. Preventive Care for Adults, Male A healthy lifestyle and preventive care can promote health and wellness. Preventive health guidelines for men include the following key practices:  A routine yearly physical is a good way to check with your health care provider about your health and preventative screening. It is a chance to share any concerns and updates on your health and to receive a thorough exam.  Visit your dentist for a routine exam and preventative care every 6 months. Brush your teeth twice a day and floss once a day. Good oral hygiene prevents tooth decay and gum disease.  The frequency of eye exams is based on your age, health, family medical history, use of contact lenses, and other factors. Follow your health care provider's recommendations for frequency of eye exams.  Eat a healthy diet. Foods such as vegetables, fruits, whole grains, low-fat dairy products, and lean protein foods contain the nutrients you need without too many calories. Decrease your intake of foods high in solid fats, added sugars, and salt. Eat the right amount of calories for you.Get information about a proper diet from your health care provider, if necessary.  Regular physical exercise is one of the most important things you can do for your health. Most adults should get at least 150 minutes of moderate-intensity exercise (any activity that increases your heart rate and causes you to sweat) each week. In addition, most adults need muscle-strengthening exercises on 2 or more days a week.  Maintain a healthy weight. The body mass index (BMI) is a screening tool to identify possible weight problems. It provides an estimate of body fat based on height and weight. Your health care provider can find your BMI and can help you achieve or maintain  a healthy weight.For adults 20 years and older:  A BMI below 18.5 is considered underweight.  A BMI of 18.5 to 24.9 is normal.  A BMI of 25 to 29.9 is considered overweight.  A BMI of 30 and above is considered obese.  Maintain normal blood lipids and cholesterol levels by exercising and minimizing your intake of saturated fat. Eat a balanced diet with plenty of fruit and vegetables. Blood tests for lipids and cholesterol should begin at age 34 and be repeated every 5 years. If your lipid or cholesterol levels are high, you are over 50, or you are at high risk for heart disease, you may need your cholesterol levels checked more frequently.Ongoing high lipid and cholesterol levels should be treated with medicines if diet and exercise are not working.  If you smoke, find out from your health care provider how to quit. If you do not use tobacco, do not start.  Lung cancer screening is recommended for adults aged 20-80 years who are at high risk for developing lung cancer because of a history of smoking. A yearly low-dose CT scan of the lungs is recommended for people who have at least a 30-pack-year history of smoking and are a current smoker or have quit within the past 15 years. A pack year of smoking is smoking an average of 1 pack of cigarettes a day for 1 year (for example: 1 pack a day for 30 years or 2 packs a day for 15 years). Yearly screening should continue until the smoker has stopped smoking for at least 15  years. Yearly screening should be stopped for people who develop a health problem that would prevent them from having lung cancer treatment.  If you choose to drink alcohol, do not have more than 2 drinks per day. One drink is considered to be 12 ounces (355 mL) of beer, 5 ounces (148 mL) of wine, or 1.5 ounces (44 mL) of liquor.  Avoid use of street drugs. Do not share needles with anyone. Ask for help if you need support or instructions about stopping the use of drugs.  High  blood pressure causes heart disease and increases the risk of stroke. Your blood pressure should be checked at least every 1-2 years. Ongoing high blood pressure should be treated with medicines, if weight loss and exercise are not effective.  If you are 36-57 years old, ask your health care provider if you should take aspirin to prevent heart disease.  Diabetes screening is done by taking a blood sample to check your blood glucose level after you have not eaten for a certain period of time (fasting). If you are not overweight and you do not have risk factors for diabetes, you should be screened once every 3 years starting at age 67. If you are overweight or obese and you are 38-28 years of age, you should be screened for diabetes every year as part of your cardiovascular risk assessment.  Colorectal cancer can be detected and often prevented. Most routine colorectal cancer screening begins at the age of 39 and continues through age 59. However, your health care provider may recommend screening at an earlier age if you have risk factors for colon cancer. On a yearly basis, your health care provider may provide home test kits to check for hidden blood in the stool. Use of a small camera at the end of a tube to directly examine the colon (sigmoidoscopy or colonoscopy) can detect the earliest forms of colorectal cancer. Talk to your health care provider about this at age 34, when routine screening begins. Direct exam of the colon should be repeated every 5-10 years through age 10, unless early forms of precancerous polyps or small growths are found.  People who are at an increased risk for hepatitis B should be screened for this virus. You are considered at high risk for hepatitis B if:  You were born in a country where hepatitis B occurs often. Talk with your health care provider about which countries are considered high risk.  Your parents were born in a high-risk country and you have not received a shot  to protect against hepatitis B (hepatitis B vaccine).  You have HIV or AIDS.  You use needles to inject street drugs.  You live with, or have sex with, someone who has hepatitis B.  You are a man who has sex with other men (MSM).  You get hemodialysis treatment.  You take certain medicines for conditions such as cancer, organ transplantation, and autoimmune conditions.  Hepatitis C blood testing is recommended for all people born from [birth year] through 1965 and any individual with known risks for hepatitis C.  Practice safe sex. Use condoms and avoid high-risk sexual practices to reduce the spread of sexually transmitted infections (STIs). STIs include gonorrhea, chlamydia, syphilis, trichomonas, herpes, HPV, and human immunodeficiency virus (HIV). Herpes, HIV, and HPV are viral illnesses that have no cure. They can result in disability, cancer, and death.  If you are a man who has sex with other men, you should be screened at least once per  year for:  HIV.  Urethral, rectal, and pharyngeal infection of gonorrhea, chlamydia, or both.  If you are at risk of being infected with HIV, it is recommended that you take a prescription medicine daily to prevent HIV infection. This is called preexposure prophylaxis (PrEP). You are considered at risk if:  You are a man who has sex with other men (MSM) and have other risk factors.  You are a heterosexual man, are sexually active, and are at increased risk for HIV infection.  You take drugs by injection.  You are sexually active with a partner who has HIV.  Talk with your health care provider about whether you are at high risk of being infected with HIV. If you choose to begin PrEP, you should first be tested for HIV. You should then be tested every 3 months for as long as you are taking PrEP.  A one-time screening for abdominal aortic aneurysm (AAA) and surgical repair of large AAAs by ultrasound are recommended for men ages 4 to 31 years who are  current or former smokers.  Healthy men should no longer receive prostate-specific antigen (PSA) blood tests as part of routine cancer screening. Talk with your health care provider about prostate cancer screening.  Testicular cancer screening is not recommended for adult males who have no symptoms. Screening includes self-exam, a health care provider exam, and other screening tests. Consult with your health care provider about any symptoms you have or any concerns you have about testicular cancer.  Use sunscreen. Apply sunscreen liberally and repeatedly throughout the day. You should seek shade when your shadow is shorter than you. Protect yourself by wearing long sleeves, pants, a wide-brimmed hat, and sunglasses year round, whenever you are outdoors.  Once a month, do a whole-body skin exam, using a mirror to look at the skin on your back. Tell your health care provider about new moles, moles that have irregular borders, moles that are larger than a pencil eraser, or moles that have changed in shape or color.  Stay current with required vaccines (immunizations).  Influenza vaccine. All adults should be immunized every year.  Tetanus, diphtheria, and acellular pertussis (Td, Tdap) vaccine. An adult who has not previously received Tdap or who does not know his vaccine status should receive 1 dose of Tdap. This initial dose should be followed by tetanus and diphtheria toxoids (Td) booster doses every 10 years. Adults with an unknown or incomplete history of completing a 3-dose immunization series with Td-containing vaccines should begin or complete a primary immunization series including a Tdap dose. Adults should receive a Td booster every 10 years.  Varicella vaccine. An adult without evidence of immunity to varicella should receive 2 doses or a second dose if he has previously received 1 dose.  Human papillomavirus (HPV) vaccine. Males aged 11-21 years who have not received the vaccine  previously should receive the 3-dose series. Males aged 22-26 years may be immunized. Immunization is recommended through the age of 14 years for any male who has sex with males and did not get any or all doses earlier. Immunization is recommended for any person with an immunocompromised condition through the age of 40 years if he did not get any or all doses earlier. During the 3-dose series, the second dose should be obtained 4-8 weeks after the first dose. The third dose should be obtained 24 weeks after the first dose and 16 weeks after the second dose.  Zoster vaccine. One dose is recommended for adults  aged 46 years or older unless certain conditions are present.  Measles, mumps, and rubella (MMR) vaccine. Adults born before [birth year] generally are considered immune to measles and mumps. Adults born in [birth year] or later should have 1 or more doses of MMR vaccine unless there is a contraindication to the vaccine or there is laboratory evidence of immunity to each of the three diseases. A routine second dose of MMR vaccine should be obtained at least 28 days after the first dose for students attending postsecondary schools, health care workers, or international travelers. People who received inactivated measles vaccine or an unknown type of measles vaccine during 1963-1967 should receive 2 doses of MMR vaccine. People who received inactivated mumps vaccine or an unknown type of mumps vaccine before 1979 and are at high risk for mumps infection should consider immunization with 2 doses of MMR vaccine. Unvaccinated health care workers born before [birth year] who lack laboratory evidence of measles, mumps, or rubella immunity or laboratory confirmation of disease should consider measles and mumps immunization with 2 doses of MMR vaccine or rubella immunization with 1 dose of MMR vaccine.  Pneumococcal 13-valent conjugate (PCV13) vaccine. When indicated, a person who is uncertain of his immunization history and has no record  of immunization should receive the PCV13 vaccine. All adults 30 years of age and older should receive this vaccine. An adult aged 79 years or older who has certain medical conditions and has not been previously immunized should receive 1 dose of PCV13 vaccine. This PCV13 should be followed with a dose of pneumococcal polysaccharide (PPSV23) vaccine. Adults who are at high risk for pneumococcal disease should obtain the PPSV23 vaccine at least 8 weeks after the dose of PCV13 vaccine. Adults older than 62 years of age who have normal immune system function should obtain the PPSV23 vaccine dose at least 1 year after the dose of PCV13 vaccine.  Pneumococcal polysaccharide (PPSV23) vaccine. When PCV13 is also indicated, PCV13 should be obtained first. All adults aged 30 years and older should be immunized. An adult younger than age 84 years who has certain medical conditions should be immunized. Any person who resides in a nursing home or long-term care facility should be immunized. An adult smoker should be immunized. People with an immunocompromised condition and certain other conditions should receive both PCV13 and PPSV23 vaccines. People with human immunodeficiency virus (HIV) infection should be immunized as soon as possible after diagnosis. Immunization during chemotherapy or radiation therapy should be avoided. Routine use of PPSV23 vaccine is not recommended for American Indians, [place] Natives, or people younger than 65 years unless there are medical conditions that require PPSV23 vaccine. When indicated, people who have unknown immunization and have no record of immunization should receive PPSV23 vaccine. One-time revaccination 5 years after the first dose of PPSV23 is recommended for people aged 19-64 years who have chronic kidney failure, nephrotic syndrome, asplenia, or immunocompromised conditions. People who received 1-2 doses of PPSV23 before age 43 years should receive another dose of PPSV23 vaccine  at age 49 years or later if at least 5 years have passed since the previous dose. Doses of PPSV23 are not needed for people immunized with PPSV23 at or after age 94 years.  Meningococcal vaccine. Adults with asplenia or persistent complement component deficiencies should receive 2 doses of quadrivalent meningococcal conjugate (MenACWY-D) vaccine. The doses should be obtained at least 2 months apart. Microbiologists working with certain meningococcal bacteria, [place] recruits, people at risk during an outbreak, and people who travel  to or live in countries with a high rate of meningitis should be immunized. A first-year college student up through age 30 years who is living in a residence hall should receive a dose if he did not receive a dose on or after his 16th birthday. Adults who have certain high-risk conditions should receive one or more doses of vaccine.  Hepatitis A vaccine. Adults who wish to be protected from this disease, have chronic liver disease, work with hepatitis A-infected animals, work in hepatitis A research labs, or travel to or work in countries with a high rate of hepatitis A should be immunized. Adults who were previously unvaccinated and who anticipate close contact with an international adoptee during the first 60 days after arrival in the [place] from a country with a high rate of hepatitis A should be immunized.  Hepatitis B vaccine. Adults should be immunized if they wish to be protected from this disease, are under age 25 years and have diabetes, have chronic liver disease, have had more than one sex partner in the past 6 months, may be exposed to blood or other infectious body fluids, are household contacts or sex partners of hepatitis B positive people, are clients or workers in certain care facilities, or travel to or work in countries with a high rate of hepatitis B.  Haemophilus influenzae type b (Hib) vaccine. A previously unvaccinated person with asplenia or sickle  cell disease or having a scheduled splenectomy should receive 1 dose of Hib vaccine. Regardless of previous immunization, a recipient of a hematopoietic stem cell transplant should receive a 3-dose series 6-12 months after his successful transplant. Hib vaccine is not recommended for adults with HIV infection. Preventive Service / Frequency Ages 49 to 32  Blood pressure check.** / Every 3-5 years.  Lipid and cholesterol check.** / Every 5 years beginning at age 51.  Hepatitis C blood test.** / For any individual with known risks for hepatitis C.  Skin self-exam. / Monthly.  Influenza vaccine. / Every year.  Tetanus, diphtheria, and acellular pertussis (Tdap, Td) vaccine.** / Consult your health care provider. 1 dose of Td every 10 years.  Varicella vaccine.** / Consult your health care provider.  HPV vaccine. / 3 doses over 6 months, if 66 or younger.  Measles, mumps, rubella (MMR) vaccine.** / You need at least 1 dose of MMR if you were born in 1957 or later. You may also need a second dose.  Pneumococcal 13-valent conjugate (PCV13) vaccine.** / Consult your health care provider.  Pneumococcal polysaccharide (PPSV23) vaccine.** / 1 to 2 doses if you smoke cigarettes or if you have certain conditions.  Meningococcal vaccine.** / 1 dose if you are age 75 to 31 years and a Market researcher living in a residence hall, or have one of several medical conditions. You may also need additional booster doses.  Hepatitis A vaccine.** / Consult your health care provider.  Hepatitis B vaccine.** / Consult your health care provider.  Haemophilus influenzae type b (Hib) vaccine.** / Consult your health care provider. Ages 73 to 87  Blood pressure check.** / Every year.  Lipid and cholesterol check.** / Every 5 years beginning at age 22.  Lung cancer screening. / Every year if you are aged 24-80 years and have a 30-pack-year history of smoking and currently smoke or have quit within  the past 15 years. Yearly screening is stopped once you have quit smoking for at least 15 years or develop a health problem that would  prevent you from having lung cancer treatment.  Fecal occult blood test (FOBT) of stool. / Every year beginning at age 78 and continuing until age 23. You may not have to do this test if you get a colonoscopy every 10 years.  Flexible sigmoidoscopy** or colonoscopy.** / Every 5 years for a flexible sigmoidoscopy or every 10 years for a colonoscopy beginning at age 15 and continuing until age 20.  Hepatitis C blood test.** / For all people born from [birth year] through 1965 and any individual with known risks for hepatitis C.  Skin self-exam. / Monthly.  Influenza vaccine. / Every year.  Tetanus, diphtheria, and acellular pertussis (Tdap/Td) vaccine.** / Consult your health care provider. 1 dose of Td every 10 years.  Varicella vaccine.** / Consult your health care provider.  Zoster vaccine.** / 1 dose for adults aged 30 years or older.  Measles, mumps, rubella (MMR) vaccine.** / You need at least 1 dose of MMR if you were born in 1959 or later. You may also need a second dose.  Pneumococcal 13-valent conjugate (PCV13) vaccine.** / Consult your health care provider.  Pneumococcal polysaccharide (PPSV23) vaccine.** / 1 to 2 doses if you smoke cigarettes or if you have certain conditions.  Meningococcal vaccine.** / Consult your health care provider.  Hepatitis A vaccine.** / Consult your health care provider.  Hepatitis B vaccine.** / Consult your health care provider.  Haemophilus influenzae type b (Hib) vaccine.** / Consult your health care provider. Ages 47 and over  Blood pressure check.** / Every year.  Lipid and cholesterol check.**/ Every 5 years beginning at age 95.  Lung cancer screening. / Every year if you are aged 102-80 years and have a 30-pack-year history of smoking and currently smoke or have quit within the past 15 years. Yearly screening  is stopped once you have quit smoking for at least 15 years or develop a health problem that would prevent you from having lung cancer treatment.  Fecal occult blood test (FOBT) of stool. / Every year beginning at age 83 and continuing until age 45. You may not have to do this test if you get a colonoscopy every 10 years.  Flexible sigmoidoscopy** or colonoscopy.** / Every 5 years for a flexible sigmoidoscopy or every 10 years for a colonoscopy beginning at age 31 and continuing until age 67.  Hepatitis C blood test.** / For all people born from [birth year] through 1967 and any individual with known risks for hepatitis C.  Abdominal aortic aneurysm (AAA) screening.** / A one-time screening for ages 3 to 47 years who are current or former smokers.  Skin self-exam. / Monthly.  Influenza vaccine. / Every year.  Tetanus, diphtheria, and acellular pertussis (Tdap/Td) vaccine.** / 1 dose of Td every 10 years.  Varicella vaccine.** / Consult your health care provider.  Zoster vaccine.** / 1 dose for adults aged 107 years or older.  Pneumococcal 13-valent conjugate (PCV13) vaccine.** / 1 dose for all adults aged 75 years and older.  Pneumococcal polysaccharide (PPSV23) vaccine.** / 1 dose for all adults aged 11 years and older.  Meningococcal vaccine.** / Consult your health care provider.  Hepatitis A vaccine.** / Consult your health care provider.  Hepatitis B vaccine.** / Consult your health care provider.  Haemophilus influenzae type b (Hib) vaccine.** / Consult your health care provider. **Family history and personal history of risk and conditions may change your health care provider's recommendations.   This information is not intended to replace advice given to you by  your health care provider. Make sure you discuss any questions you have with your health care provider.   Document Released: 01/22/2002 Document Revised: 12/17/2014 Document Reviewed: 04/23/2011 Elsevier Interactive Patient  Education [employer].

## 2016-04-06 NOTE — Progress Notes (Signed)
Pre visit review using our clinic review tool, if applicable. No additional management support is needed unless otherwise documented below in the visit note. 

## 2016-04-06 NOTE — Progress Notes (Signed)
Subjective:    Patient ID: Gerald Booth, male    DOB: 07-01-1956, 60 y.o.   MRN: [id number]  Chief Complaint  Patient presents with  . Annual Exam    HPI Patient is in today for Annual Physical Exam.  Patient reports loosing a twin brother recently has been coping well, but still a great struggle.  Patient also reports a recent muscle strain in upper back in December 2016, he was treated for in med center ER. Patient also reports having some basal skin cancer removed off the nose.  Denies CP/palp/SOB/HA/congestion/fevers/GI or GU c/o. Taking meds as prescribed.   Past Medical History  Diagnosis Date  . Elevated BP 10/03/2012  . Obese 10/03/2012  . Chicken pox as a child  . Measles as a child  . Mumps as a child  . Kidney stones 10/10/2012  . Hearing loss 10/10/2012  . Onychomycosis 10/10/2012  . ED (erectile dysfunction) 10/10/2012  . 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 Preventative health care 10/10/2012  . Anxiety and depression 01/27/2013  . Depression with anxiety 01/27/2013  . Skin cancer of face 04/06/2016  . Elevated BP 10/03/2012    Past Surgical History  Procedure Laterality Date  . Wisdom tooth extraction  2012    1 tooth    Family History  Problem Relation Age of Onset  . Cancer Mother     breast, bone  . Hyperlipidemia Father   . Other Father     prostate surgery  . Heart attack Maternal Grandmother   . Kidney disease Maternal Grandfather     kidney failure  . AAA (abdominal aortic aneurysm) Brother     Social History   Social History  . Marital Status: Married    Spouse Name: N/A  . Number of Children: N/A  . Years of Education: N/A   Occupational History  . Not on file.   Social History Main Topics  . Smoking status: Former Smoker    Types: Cigarettes    Start date: 12/11/1991  . Smokeless tobacco: Never Used     Comment: smoked on weekends  . Alcohol Use: Yes     Comment: 1 a day  . Drug Use: No  . Sexual Activity: Yes     Comment: no  dietary changes, lives with wife   Other Topics Concern  . Not on file   Social History Narrative    Meds: see MAR  Allergies  Allergen Reactions  . Penicillins     Nose bleeds    Review of Systems  Constitutional: Negative for fever and malaise/fatigue.  HENT: Negative for congestion.   Eyes: Negative for blurred vision.  Respiratory: Negative for shortness of breath.   Cardiovascular: Negative for chest pain, palpitations and leg swelling.  Gastrointestinal: Negative for nausea, abdominal pain and blood in stool.  Genitourinary: Negative for dysuria and frequency.  Musculoskeletal: Negative for falls.  Skin: Negative for rash.  Neurological: Negative for dizziness, loss of consciousness and headaches.  Endo/Heme/Allergies: Negative for environmental allergies.  Psychiatric/Behavioral: Negative for depression. The patient is not nervous/anxious.        Objective:    Physical Exam  Constitutional: He is oriented to person, place, and time. He appears well-developed and well-nourished. No distress.  HENT:  Head: Normocephalic and atraumatic.  Eyes: Conjunctivae are normal.  Neck: Neck supple. No thyromegaly present.  Cardiovascular: Normal rate, regular rhythm and normal heart sounds.   No murmur heard. Pulmonary/Chest: Effort normal and breath sounds normal. No respiratory  distress. He has no wheezes.  Abdominal: Soft. Bowel sounds are normal. He exhibits no mass. There is no tenderness.  Musculoskeletal: He exhibits no edema.  Lymphadenopathy:    He has no cervical adenopathy.  Neurological: He is alert and oriented to person, place, and time.  Skin: Skin is warm and dry.  Psychiatric: He has a normal mood and affect. His behavior is normal.    BP 124/72 mmHg  Pulse 84  Temp(Src) 94 F (34.4 C) (Oral)  Ht 6\' 3"  (1.905 m)  Wt 241 lb 4 oz (109.43 kg)  BMI 30.15 kg/m2  SpO2 94% Wt Readings from Last 3 Encounters:  04/06/16 241 lb 4 oz (109.43 kg)  11/14/15  235 lb (106.595 kg)  09/01/15 245 lb 2 oz (111.188 kg)     Lab Results  Component Value Date   WBC 5.2 02/29/2016   HGB 14.8 02/29/2016   HCT 42.8 02/29/2016   PLT 180.0 02/29/2016   GLUCOSE 116* 02/29/2016   CHOL 205* 02/29/2016   TRIG 231.0* 02/29/2016   HDL 40.60 02/29/2016   LDLDIRECT 125.0 02/29/2016   LDLCALC 130* 08/29/2015   ALT 11 02/29/2016   AST 15 02/29/2016   NA 142 02/29/2016   K 4.0 02/29/2016   CL 108 02/29/2016   CREATININE 1.00 02/29/2016   BUN 14 02/29/2016   CO2 28 02/29/2016   TSH 4.17 02/29/2016   PSA 0.79 02/29/2016   HGBA1C 5.3 02/29/2016    Lab Results  Component Value Date   TSH 4.17 02/29/2016   Lab Results  Component Value Date   WBC 5.2 02/29/2016   HGB 14.8 02/29/2016   HCT 42.8 02/29/2016   MCV 91.4 02/29/2016   PLT 180.0 02/29/2016   Lab Results  Component Value Date   NA 142 02/29/2016   K 4.0 02/29/2016   CO2 28 02/29/2016   GLUCOSE 116* 02/29/2016   BUN 14 02/29/2016   CREATININE 1.00 02/29/2016   BILITOT 0.5 02/29/2016   ALKPHOS 62 02/29/2016   AST 15 02/29/2016   ALT 11 02/29/2016   PROT 6.8 02/29/2016   ALBUMIN 4.1 02/29/2016   CALCIUM 9.4 02/29/2016   GFR 80.51 02/29/2016   Lab Results  Component Value Date   CHOL 205* 02/29/2016   Lab Results  Component Value Date   HDL 40.60 02/29/2016   Lab Results  Component Value Date   LDLCALC 130* 08/29/2015   Lab Results  Component Value Date   TRIG 231.0* 02/29/2016   Lab Results  Component Value Date   CHOLHDL 5 02/29/2016   Lab Results  Component Value Date   HGBA1C 5.3 02/29/2016       Assessment & Plan:   Problem List Items Addressed This Visit    Preventative health care    Patient encouraged to maintain heart healthy diet, regular exercise, adequate sleep. Consider daily probiotics. Take medications as prescribed. Given and reviewed copy of ACP documents from [facility] Secretary of State and encouraged to complete and return      Relevant  Medications   tadalafil (CIALIS) 5 MG tablet   ranitidine (ZANTAC) 300 MG tablet   escitalopram (LEXAPRO) 5 MG tablet   Efinaconazole (JUBLIA) 10 % SOLN   celecoxib (CELEBREX) 200 MG capsule   ALPRAZolam (XANAX) 0.25 MG tablet   acyclovir (ZOVIRAX) 800 MG tablet   Other Relevant Orders   CBC   TSH   Lipid panel   Comprehensive metabolic panel   Hemoglobin A1c   Onychomycosis  Relevant Medications   tadalafil (CIALIS) 5 MG tablet   ranitidine (ZANTAC) 300 MG tablet   escitalopram (LEXAPRO) 5 MG tablet   Efinaconazole (JUBLIA) 10 % SOLN   celecoxib (CELEBREX) 200 MG capsule   ALPRAZolam (XANAX) 0.25 MG tablet   acyclovir (ZOVIRAX) 800 MG tablet   Other Relevant Orders   CBC   TSH   Lipid panel   Comprehensive metabolic panel   Hemoglobin A1c   Hyperglycemia    hgba1c acceptable, minimize simple carbs. Increase exercise as tolerated. Continue current meds      Relevant Medications   tadalafil (CIALIS) 5 MG tablet   ranitidine (ZANTAC) 300 MG tablet   escitalopram (LEXAPRO) 5 MG tablet   Efinaconazole (JUBLIA) 10 % SOLN   celecoxib (CELEBREX) 200 MG capsule   ALPRAZolam (XANAX) 0.25 MG tablet   acyclovir (ZOVIRAX) 800 MG tablet   Other Relevant Orders   CBC   TSH   Lipid panel   Comprehensive metabolic panel   Hemoglobin A1c   HYPERCHOLESTEROLEMIA - Primary    Encouraged heart healthy diet, increase exercise, avoid trans fats, has started on krill oil cap daily      Relevant Medications   tadalafil (CIALIS) 5 MG tablet   ranitidine (ZANTAC) 300 MG tablet   escitalopram (LEXAPRO) 5 MG tablet   Efinaconazole (JUBLIA) 10 % SOLN   celecoxib (CELEBREX) 200 MG capsule   ALPRAZolam (XANAX) 0.25 MG tablet   acyclovir (ZOVIRAX) 800 MG tablet   Other Relevant Orders   CBC   TSH   Lipid panel   Comprehensive metabolic panel   Hemoglobin A1c   Elevated BP    Well controlled. Encouraged heart healthy diet such as the DASH diet and exercise as tolerated.        Relevant Medications   tadalafil (CIALIS) 5 MG tablet   ranitidine (ZANTAC) 300 MG tablet   escitalopram (LEXAPRO) 5 MG tablet   Efinaconazole (JUBLIA) 10 % SOLN   celecoxib (CELEBREX) 200 MG capsule   ALPRAZolam (XANAX) 0.25 MG tablet   acyclovir (ZOVIRAX) 800 MG tablet   Other Relevant Orders   CBC   TSH   Lipid panel   Comprehensive metabolic panel   Hemoglobin A1c   ED (erectile dysfunction)   Relevant Medications   tadalafil (CIALIS) 5 MG tablet   ranitidine (ZANTAC) 300 MG tablet   escitalopram (LEXAPRO) 5 MG tablet   Efinaconazole (JUBLIA) 10 % SOLN   celecoxib (CELEBREX) 200 MG capsule   ALPRAZolam (XANAX) 0.25 MG tablet   acyclovir (ZOVIRAX) 800 MG tablet   Other Relevant Orders   CBC   TSH   Lipid panel   Comprehensive metabolic panel   Hemoglobin A1c   Depression with anxiety   Relevant Medications   tadalafil (CIALIS) 5 MG tablet   ranitidine (ZANTAC) 300 MG tablet   escitalopram (LEXAPRO) 5 MG tablet   Efinaconazole (JUBLIA) 10 % SOLN   celecoxib (CELEBREX) 200 MG capsule   ALPRAZolam (XANAX) 0.25 MG tablet   acyclovir (ZOVIRAX) 800 MG tablet   Other Relevant Orders   CBC   TSH   Lipid panel   Comprehensive metabolic panel   Hemoglobin A1c    Other Visit Diagnoses    Reflux        Relevant Medications    tadalafil (CIALIS) 5 MG tablet    ranitidine (ZANTAC) 300 MG tablet    escitalopram (LEXAPRO) 5 MG tablet    Efinaconazole (JUBLIA) 10 % SOLN    celecoxib (  CELEBREX) 200 MG capsule    ALPRAZolam (XANAX) 0.25 MG tablet    acyclovir (ZOVIRAX) 800 MG tablet    Other Relevant Orders    CBC    TSH    Lipid panel    Comprehensive metabolic panel    Hemoglobin A1c    Anxiety and depression        Relevant Medications    tadalafil (CIALIS) 5 MG tablet    ranitidine (ZANTAC) 300 MG tablet    escitalopram (LEXAPRO) 5 MG tablet    Efinaconazole (JUBLIA) 10 % SOLN    celecoxib (CELEBREX) 200 MG capsule    ALPRAZolam (XANAX) 0.25 MG tablet     acyclovir (ZOVIRAX) 800 MG tablet    Other Relevant Orders    CBC    TSH    Lipid panel    Comprehensive metabolic panel    Hemoglobin A1c    Hyperlipidemia, mixed        Relevant Medications    tadalafil (CIALIS) 5 MG tablet    ranitidine (ZANTAC) 300 MG tablet    escitalopram (LEXAPRO) 5 MG tablet    Efinaconazole (JUBLIA) 10 % SOLN    celecoxib (CELEBREX) 200 MG capsule    ALPRAZolam (XANAX) 0.25 MG tablet    acyclovir (ZOVIRAX) 800 MG tablet    Other Relevant Orders    CBC    TSH    Lipid panel    Comprehensive metabolic panel    Hemoglobin A1c       I have changed Mr. Fluitt ranitidine. I am also having him maintain his aspirin EC, acetaminophen-codeine, tadalafil, escitalopram, Efinaconazole, celecoxib, ALPRAZolam, acyclovir, and KRILL OIL OMEGA-3 PO.  Meds ordered this encounter  Medications  . tadalafil (CIALIS) 5 MG tablet    Sig: Take 1 tablet (5 mg total) by mouth daily as needed for erectile dysfunction.    Dispense:  30 tablet    Refill:  6  . ranitidine (ZANTAC) 300 MG tablet    Sig: TAKE 1 TABLET (300 MG TOTAL) BY MOUTH AT BEDTIME.    Dispense:  30 tablet    Refill:  5  . escitalopram (LEXAPRO) 5 MG tablet    Sig: Take 1 tablet (5 mg total) by mouth daily.    Dispense:  90 tablet    Refill:  3  . Efinaconazole (JUBLIA) 10 % SOLN    Sig: Apply 1 application topically daily.    Dispense:  8 mL    Refill:  1  . celecoxib (CELEBREX) 200 MG capsule    Sig: TAKE ONE CAPSULE BY MOUTH TWICE A DAY AT BEDTIME    Dispense:  180 capsule    Refill:  2  . ALPRAZolam (XANAX) 0.25 MG tablet    Sig: Take 1 tablet (0.25 mg total) by mouth daily as needed for anxiety or sleep.    Dispense:  30 tablet    Refill:  3  . acyclovir (ZOVIRAX) 800 MG tablet    Sig: Take 1 tablet (800 mg total) by mouth 3 (three) times daily.    Dispense:  90 tablet    Refill:  1  . KRILL OIL OMEGA-3 PO    Sig: Take by mouth.     Penni Homans, MD

## 2016-04-06 NOTE — Assessment & Plan Note (Signed)
hgba1c acceptable, minimize simple carbs. Increase exercise as tolerated. Continue current meds 

## 2016-04-06 NOTE — Assessment & Plan Note (Signed)
Well controlled. Encouraged heart healthy diet such as the DASH diet and exercise as tolerated.  

## 2016-04-06 NOTE — Assessment & Plan Note (Signed)
Avoid offending foods, start probiotics. Do not eat large meals in late evening and consider raising head of bed.  

## 2016-04-13 ENCOUNTER — Ambulatory Visit (INDEPENDENT_AMBULATORY_CARE_PROVIDER_SITE_OTHER): Payer: Managed Care, Other (non HMO)

## 2016-04-13 ENCOUNTER — Other Ambulatory Visit: Payer: Self-pay | Admitting: Family Medicine

## 2016-04-13 DIAGNOSIS — H6123 Impacted cerumen, bilateral: Secondary | ICD-10-CM

## 2016-04-13 DIAGNOSIS — H6120 Impacted cerumen, unspecified ear: Secondary | ICD-10-CM

## 2016-04-13 MED ORDER — NEOMYCIN-POLYMYXIN-HC 3.5-10000-1 OT SOLN: 3.0000 [drp] | Freq: Two times a day (BID) | OTIC | Status: DC

## 2016-04-13 NOTE — Progress Notes (Signed)
Pre visit review using our clinic tool,if applicable. No additional management support is needed unless otherwise documented below in the visit note.   Patient in for Ear lavage both ears.  Patient tolerated well. Ears examined by Dr. Randel Pigg after being flushed. Medicated ear drops ordered for patient.

## 2016-08-28 ENCOUNTER — Ambulatory Visit (INDEPENDENT_AMBULATORY_CARE_PROVIDER_SITE_OTHER): Payer: Managed Care, Other (non HMO)

## 2016-08-28 DIAGNOSIS — Z23 Encounter for immunization: Secondary | ICD-10-CM

## 2016-08-28 NOTE — Progress Notes (Signed)
Pre visit review using our clinic review tool, if applicable. No additional management support is needed unless otherwise documented below in the visit note. 

## 2016-10-02 ENCOUNTER — Other Ambulatory Visit (INDEPENDENT_AMBULATORY_CARE_PROVIDER_SITE_OTHER): Payer: Managed Care, Other (non HMO)

## 2016-10-02 DIAGNOSIS — B351 Tinea unguium: Secondary | ICD-10-CM | POA: Diagnosis not present

## 2016-10-02 DIAGNOSIS — Z0000 Encounter for general adult medical examination without abnormal findings: Secondary | ICD-10-CM

## 2016-10-02 DIAGNOSIS — E7800 Pure hypercholesterolemia, unspecified: Secondary | ICD-10-CM | POA: Diagnosis not present

## 2016-10-02 DIAGNOSIS — R946 Abnormal results of thyroid function studies: Secondary | ICD-10-CM | POA: Diagnosis not present

## 2016-10-02 DIAGNOSIS — F419 Anxiety disorder, unspecified: Secondary | ICD-10-CM

## 2016-10-02 DIAGNOSIS — R739 Hyperglycemia, unspecified: Secondary | ICD-10-CM

## 2016-10-02 DIAGNOSIS — F418 Other specified anxiety disorders: Secondary | ICD-10-CM

## 2016-10-02 DIAGNOSIS — N529 Male erectile dysfunction, unspecified: Secondary | ICD-10-CM | POA: Diagnosis not present

## 2016-10-02 DIAGNOSIS — E782 Mixed hyperlipidemia: Secondary | ICD-10-CM | POA: Diagnosis not present

## 2016-10-02 DIAGNOSIS — R030 Elevated blood-pressure reading, without diagnosis of hypertension: Secondary | ICD-10-CM

## 2016-10-02 DIAGNOSIS — K219 Gastro-esophageal reflux disease without esophagitis: Secondary | ICD-10-CM

## 2016-10-02 DIAGNOSIS — F329 Major depressive disorder, single episode, unspecified: Secondary | ICD-10-CM

## 2016-10-02 DIAGNOSIS — F32A Depression, unspecified: Secondary | ICD-10-CM

## 2016-10-02 DIAGNOSIS — IMO0001 Reserved for inherently not codable concepts without codable children: Secondary | ICD-10-CM

## 2016-10-02 LAB — COMPREHENSIVE METABOLIC PANEL
ALBUMIN: 4 g/dL (ref 3.5–5.2)
ALT: 13 U/L (ref 0–53)
AST: 15 U/L (ref 0–37)
Alkaline Phosphatase: 58 U/L (ref 39–117)
BILIRUBIN TOTAL: 0.6 mg/dL (ref 0.2–1.2)
BUN: 12 mg/dL (ref 6–23)
CHLORIDE: 108 meq/L (ref 96–112)
CO2: 27 mEq/L (ref 19–32)
CREATININE: 0.99 mg/dL (ref 0.40–1.50)
Calcium: 8.9 mg/dL (ref 8.4–10.5)
GFR: 81.29 mL/min (ref >60.00)
Glucose, Bld: 110 mg/dL — ABNORMAL HIGH (ref 70–99)
Potassium: 4 mEq/L (ref 3.5–5.1)
SODIUM: 141 meq/L (ref 135–145)
Total Protein: 6.6 g/dL (ref 6.0–8.3)

## 2016-10-02 LAB — CBC
HCT: 41.3 % (ref 39.0–52.0)
Hemoglobin: 14.2 g/dL (ref 13.0–17.0)
MCHC: 34.3 g/dL (ref 30.0–36.0)
MCV: 91.6 fl (ref 78.0–100.0)
PLATELETS: 149 10*3/uL — AB (ref 150.0–400.0)
RBC: 4.51 Mil/uL (ref 4.22–5.81)
RDW: 13.7 % (ref 11.5–15.5)
WBC: 4.1 10*3/uL (ref 4.0–10.5)

## 2016-10-02 LAB — TSH: TSH: 8.03 u[IU]/mL — AB (ref 0.35–4.50)

## 2016-10-02 LAB — LIPID PANEL
CHOLESTEROL: 195 mg/dL (ref 0–200)
HDL: 41.9 mg/dL (ref >39.00)
LDL CALC: 126 mg/dL — AB (ref 0–99)
NonHDL: 152.94
TRIGLYCERIDES: 136 mg/dL (ref 0.0–149.0)
Total CHOL/HDL Ratio: 5
VLDL: 27.2 mg/dL (ref 0.0–40.0)

## 2016-10-02 LAB — HEMOGLOBIN A1C: HEMOGLOBIN A1C: 5.3 % (ref 4.6–6.5)

## 2016-10-02 LAB — T4, FREE: Free T4: 0.74 ng/dL (ref 0.60–1.60)

## 2016-10-08 ENCOUNTER — Ambulatory Visit (INDEPENDENT_AMBULATORY_CARE_PROVIDER_SITE_OTHER): Payer: Managed Care, Other (non HMO) | Admitting: Family Medicine

## 2016-10-08 ENCOUNTER — Encounter: Payer: Self-pay | Admitting: Family Medicine

## 2016-10-08 VITALS — BP 108/64 | HR 88 | Temp 98.2°F | Ht 75.0 in | Wt 247.4 lb

## 2016-10-08 DIAGNOSIS — B351 Tinea unguium: Secondary | ICD-10-CM

## 2016-10-08 DIAGNOSIS — R030 Elevated blood-pressure reading, without diagnosis of hypertension: Secondary | ICD-10-CM

## 2016-10-08 DIAGNOSIS — F419 Anxiety disorder, unspecified: Secondary | ICD-10-CM

## 2016-10-08 DIAGNOSIS — N529 Male erectile dysfunction, unspecified: Secondary | ICD-10-CM

## 2016-10-08 DIAGNOSIS — E7800 Pure hypercholesterolemia, unspecified: Secondary | ICD-10-CM

## 2016-10-08 DIAGNOSIS — M2550 Pain in unspecified joint: Secondary | ICD-10-CM | POA: Diagnosis not present

## 2016-10-08 DIAGNOSIS — F329 Major depressive disorder, single episode, unspecified: Secondary | ICD-10-CM

## 2016-10-08 DIAGNOSIS — F418 Other specified anxiety disorders: Secondary | ICD-10-CM | POA: Diagnosis not present

## 2016-10-08 DIAGNOSIS — Z0000 Encounter for general adult medical examination without abnormal findings: Secondary | ICD-10-CM

## 2016-10-08 DIAGNOSIS — E782 Mixed hyperlipidemia: Secondary | ICD-10-CM

## 2016-10-08 DIAGNOSIS — F32A Depression, unspecified: Secondary | ICD-10-CM

## 2016-10-08 DIAGNOSIS — R739 Hyperglycemia, unspecified: Secondary | ICD-10-CM

## 2016-10-08 LAB — SEDIMENTATION RATE: Sed Rate: 13 mm/hr (ref 0–20)

## 2016-10-08 LAB — C-REACTIVE PROTEIN: CRP: 0.1 mg/dL — ABNORMAL LOW (ref 0.5–20.0)

## 2016-10-08 MED ORDER — TADALAFIL 5 MG PO TABS: 5.0000 mg | ORAL_TABLET | Freq: Every day | ORAL | 5 refills | Status: DC | PRN

## 2016-10-08 MED ORDER — ALPRAZOLAM 0.25 MG PO TABS: 0.2500 mg | ORAL_TABLET | Freq: Two times a day (BID) | ORAL | 1 refills | Status: DC | PRN

## 2016-10-08 MED ORDER — NEOMYCIN-POLYMYXIN-HC 3.5-10000-1 OT SOLN: 3.0000 [drp] | Freq: Two times a day (BID) | OTIC | 1 refills | Status: DC

## 2016-10-08 MED ORDER — ESCITALOPRAM OXALATE 5 MG PO TABS: 5.0000 mg | ORAL_TABLET | Freq: Every day | ORAL | 1 refills | Status: DC

## 2016-10-08 MED ORDER — ACYCLOVIR 800 MG PO TABS: 800.0000 mg | ORAL_TABLET | Freq: Three times a day (TID) | ORAL | 1 refills | Status: DC | PRN

## 2016-10-08 MED ORDER — EFINACONAZOLE 10 % EX SOLN: 1.0000 "application " | Freq: Every day | CUTANEOUS | 1 refills | Status: DC

## 2016-10-08 MED ORDER — CELECOXIB 200 MG PO CAPS: ORAL_CAPSULE | ORAL | 1 refills | Status: DC

## 2016-10-08 NOTE — Patient Instructions (Signed)

## 2016-10-08 NOTE — Progress Notes (Signed)
Pre visit review using our clinic review tool, if applicable. No additional management support is needed unless otherwise documented below in the visit note. 

## 2016-10-09 ENCOUNTER — Other Ambulatory Visit: Payer: Self-pay | Admitting: Family Medicine

## 2016-10-09 DIAGNOSIS — M0580 Other rheumatoid arthritis with rheumatoid factor of unspecified site: Secondary | ICD-10-CM

## 2016-10-09 LAB — RHEUMATOID FACTOR: RHEUMATOID FACTOR: 23 [IU]/mL — AB (ref <14)

## 2016-10-09 LAB — ANA: Anti Nuclear Antibody(ANA): NEGATIVE

## 2016-10-14 DIAGNOSIS — M2550 Pain in unspecified joint: Secondary | ICD-10-CM | POA: Insufficient documentation

## 2016-10-14 NOTE — Assessment & Plan Note (Signed)
minimize simple carbs. Increase exercise as tolerated.  

## 2016-10-14 NOTE — Progress Notes (Signed)
Patient ID: Gerald Booth, male   DOB: [birth year], 60 y.o.   MRN: [id number]   Subjective:    Patient ID: Gerald Booth, male    DOB: Dec 01, 1956, 60 y.o.   MRN: [id number]  Chief Complaint  Patient presents with  . Follow-up    HPI Patient is in today for follow up. Doing well today. His major complaint is stillness and pain in hands. Notes thick knuckles and weakness to a degree. No warmth or redness, no injury. Denies CP/palp/SOB/HA/congestion/fevers/GI or GU c/o. Taking meds as prescribed  Past Medical History:  Diagnosis Date  . Anxiety and depression 01/27/2013  . Chicken pox as a child  . Depression with anxiety 01/27/2013  . ED (erectile dysfunction) 10/10/2012  . Elevated BP 10/03/2012  . Elevated BP 10/03/2012  . Hearing loss 10/10/2012  . 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 Kidney stones 10/10/2012  . Measles as a child  . Mumps as a child  . Obese 10/03/2012  . Onychomycosis 10/10/2012  . Preventative health care 10/10/2012  . Skin cancer of face 04/06/2016    Past Surgical History:  Procedure Laterality Date  . WISDOM TOOTH EXTRACTION  2012   1 tooth    Family History  Problem Relation Age of Onset  . Cancer Mother     breast, bone  . Hyperlipidemia Father   . Other Father     prostate surgery  . Heart attack Maternal Grandmother   . Kidney disease Maternal Grandfather     kidney failure  . AAA (abdominal aortic aneurysm) Brother     Social History   Social History  . Marital status: Married    Spouse name: N/A  . Number of children: N/A  . Years of education: N/A   Occupational History  . Not on file.   Social History Main Topics  . Smoking status: Former Smoker    Types: Cigarettes    Start date: 12/11/1991  . Smokeless tobacco: Never Used     Comment: smoked on weekends  . Alcohol use Yes     Comment: 1 a day  . Drug use: No  . Sexual activity: Yes     Comment: no dietary changes, lives with wife   Other Topics Concern  . Not on  file   Social History Narrative  . No narrative on file    Outpatient Medications Prior to Visit  Medication Sig Dispense Refill  . acetaminophen-codeine (TYLENOL #3) 300-30 MG tablet Take 1-2 tablets by mouth every 6 (six) hours as needed for moderate pain. 15 tablet 0  . aspirin EC 81 MG tablet Take 1 tablet (81 mg total) by mouth daily.    Marland Kitchen KRILL OIL OMEGA-3 PO Take by mouth.    . ranitidine (ZANTAC) 300 MG tablet TAKE 1 TABLET (300 MG TOTAL) BY MOUTH AT BEDTIME. 30 tablet 5  . acyclovir (ZOVIRAX) 800 MG tablet Take 1 tablet (800 mg total) by mouth 3 (three) times daily. 90 tablet 1  . ALPRAZolam (XANAX) 0.25 MG tablet Take 1 tablet (0.25 mg total) by mouth daily as needed for anxiety or sleep. 30 tablet 3  . celecoxib (CELEBREX) 200 MG capsule TAKE ONE CAPSULE BY MOUTH TWICE A DAY AT BEDTIME 180 capsule 2  . Efinaconazole (JUBLIA) 10 % SOLN Apply 1 application topically daily. 8 mL 1  . escitalopram (LEXAPRO) 5 MG tablet Take 1 tablet (5 mg total) by mouth daily. 90 tablet 3  . neomycin-polymyxin-hydrocortisone (CORTISPORIN) otic solution Place 3 drops  into both ears 2 (two) times daily. 10 mL 1  . tadalafil (CIALIS) 5 MG tablet Take 1 tablet (5 mg total) by mouth daily as needed for erectile dysfunction. 30 tablet 6   No facility-administered medications prior to visit.     Allergies  Allergen Reactions  . Penicillins     Nose bleeds    Review of Systems  Constitutional: Negative for fever and malaise/fatigue.  HENT: Negative for congestion.   Eyes: Negative for blurred vision.  Respiratory: Negative for shortness of breath.   Cardiovascular: Negative for chest pain, palpitations and leg swelling.  Gastrointestinal: Negative for abdominal pain, blood in stool and nausea.  Genitourinary: Negative for dysuria and frequency.  Musculoskeletal: Positive for joint pain. Negative for falls.  Skin: Negative for rash.  Neurological: Negative for dizziness, loss of consciousness  and headaches.  Endo/Heme/Allergies: Negative for environmental allergies.  Psychiatric/Behavioral: Negative for depression. The patient is not nervous/anxious.        Objective:    Physical Exam  Constitutional: He is oriented to person, place, and time. He appears well-developed and well-nourished. No distress.  HENT:  Head: Normocephalic and atraumatic.  Nose: Nose normal.  Eyes: Right eye exhibits no discharge. Left eye exhibits no discharge.  Neck: Normal range of motion. Neck supple.  Cardiovascular: Normal rate and regular rhythm.   No murmur heard. Pulmonary/Chest: Effort normal and breath sounds normal.  Abdominal: Soft. Bowel sounds are normal. There is no tenderness.  Musculoskeletal: He exhibits no edema.  Neurological: He is alert and oriented to person, place, and time.  Skin: Skin is warm and dry.  Psychiatric: He has a normal mood and affect.  Nursing note and vitals reviewed.   BP 108/64 (BP Location: Left Arm, Patient Position: Sitting, Cuff Size: Large)   Pulse 88   Temp 98.2 F (36.8 C) (Oral)   Ht '6\' 3"'  (1.905 m)   Wt 247 lb 6 oz (112.2 kg)   SpO2 96%   BMI 30.92 kg/m  Wt Readings from Last 3 Encounters:  10/08/16 247 lb 6 oz (112.2 kg)  04/06/16 241 lb 4 oz (109.4 kg)  11/14/15 235 lb (106.6 kg)     Lab Results  Component Value Date   WBC 4.1 10/02/2016   HGB 14.2 10/02/2016   HCT 41.3 10/02/2016   PLT 149.0 (L) 10/02/2016   GLUCOSE 110 (H) 10/02/2016   CHOL 195 10/02/2016   TRIG 136.0 10/02/2016   HDL 41.90 10/02/2016   LDLDIRECT 125.0 02/29/2016   LDLCALC 126 (H) 10/02/2016   ALT 13 10/02/2016   AST 15 10/02/2016   NA 141 10/02/2016   K 4.0 10/02/2016   CL 108 10/02/2016   CREATININE 0.99 10/02/2016   BUN 12 10/02/2016   CO2 27 10/02/2016   TSH 8.03 (H) 10/02/2016   PSA 0.79 02/29/2016   HGBA1C 5.3 10/02/2016    Lab Results  Component Value Date   TSH 8.03 (H) 10/02/2016   Lab Results  Component Value Date   WBC 4.1  10/02/2016   HGB 14.2 10/02/2016   HCT 41.3 10/02/2016   MCV 91.6 10/02/2016   PLT 149.0 (L) 10/02/2016   Lab Results  Component Value Date   NA 141 10/02/2016   K 4.0 10/02/2016   CO2 27 10/02/2016   GLUCOSE 110 (H) 10/02/2016   BUN 12 10/02/2016   CREATININE 0.99 10/02/2016   BILITOT 0.6 10/02/2016   ALKPHOS 58 10/02/2016   AST 15 10/02/2016   ALT 13 10/02/2016  PROT 6.6 10/02/2016   ALBUMIN 4.0 10/02/2016   CALCIUM 8.9 10/02/2016   GFR 81.29 10/02/2016   Lab Results  Component Value Date   CHOL 195 10/02/2016   Lab Results  Component Value Date   HDL 41.90 10/02/2016   Lab Results  Component Value Date   LDLCALC 126 (H) 10/02/2016   Lab Results  Component Value Date   TRIG 136.0 10/02/2016   Lab Results  Component Value Date   CHOLHDL 5 10/02/2016   Lab Results  Component Value Date   HGBA1C 5.3 10/02/2016       Assessment & Plan:   Problem List Items Addressed This Visit    HYPERCHOLESTEROLEMIA    Encouraged heart healthy diet, increase exercise, avoid trans fats, consider a krill oil cap daily      Relevant Medications   tadalafil (CIALIS) 5 MG tablet   escitalopram (LEXAPRO) 5 MG tablet   Efinaconazole (JUBLIA) 10 % SOLN   celecoxib (CELEBREX) 200 MG capsule   ALPRAZolam (XANAX) 0.25 MG tablet   acyclovir (ZOVIRAX) 800 MG tablet   Elevated blood pressure, situational    Well controlled. Encouraged heart healthy diet such as the DASH diet and exercise as tolerated.       Relevant Medications   tadalafil (CIALIS) 5 MG tablet   Onychomycosis   Relevant Medications   tadalafil (CIALIS) 5 MG tablet   escitalopram (LEXAPRO) 5 MG tablet   Efinaconazole (JUBLIA) 10 % SOLN   celecoxib (CELEBREX) 200 MG capsule   ALPRAZolam (XANAX) 0.25 MG tablet   acyclovir (ZOVIRAX) 800 MG tablet   ED (erectile dysfunction)   Relevant Medications   tadalafil (CIALIS) 5 MG tablet   escitalopram (LEXAPRO) 5 MG tablet   Efinaconazole (JUBLIA) 10 % SOLN    celecoxib (CELEBREX) 200 MG capsule   ALPRAZolam (XANAX) 0.25 MG tablet   acyclovir (ZOVIRAX) 800 MG tablet   Preventative health care   Relevant Medications   tadalafil (CIALIS) 5 MG tablet   escitalopram (LEXAPRO) 5 MG tablet   Efinaconazole (JUBLIA) 10 % SOLN   celecoxib (CELEBREX) 200 MG capsule   ALPRAZolam (XANAX) 0.25 MG tablet   acyclovir (ZOVIRAX) 800 MG tablet   Depression with anxiety   Relevant Medications   tadalafil (CIALIS) 5 MG tablet   escitalopram (LEXAPRO) 5 MG tablet   Efinaconazole (JUBLIA) 10 % SOLN   celecoxib (CELEBREX) 200 MG capsule   ALPRAZolam (XANAX) 0.25 MG tablet   acyclovir (ZOVIRAX) 800 MG tablet   Hyperglycemia    minimize simple carbs. Increase exercise as tolerated      Relevant Medications   tadalafil (CIALIS) 5 MG tablet   escitalopram (LEXAPRO) 5 MG tablet   Efinaconazole (JUBLIA) 10 % SOLN   celecoxib (CELEBREX) 200 MG capsule   ALPRAZolam (XANAX) 0.25 MG tablet   acyclovir (ZOVIRAX) 800 MG tablet   Arthralgia - Primary    Pain in hands with thickening in knuckles and stiffness. Encouraged to keep hands moving, use topcical treatments and check labs      Relevant Orders   Rheumatoid Factor (Completed)   C-reactive protein (Completed)   Sed Rate (ESR) (Completed)   Antinuclear Antib (ANA) (Completed)    Other Visit Diagnoses    Anxiety and depression       Relevant Medications   tadalafil (CIALIS) 5 MG tablet   escitalopram (LEXAPRO) 5 MG tablet   Efinaconazole (JUBLIA) 10 % SOLN   celecoxib (CELEBREX) 200 MG capsule   ALPRAZolam (XANAX) 0.25 MG  tablet   acyclovir (ZOVIRAX) 800 MG tablet   Hyperlipidemia, mixed       Relevant Medications   tadalafil (CIALIS) 5 MG tablet   escitalopram (LEXAPRO) 5 MG tablet   Efinaconazole (JUBLIA) 10 % SOLN   celecoxib (CELEBREX) 200 MG capsule   ALPRAZolam (XANAX) 0.25 MG tablet   acyclovir (ZOVIRAX) 800 MG tablet      I have changed Mr. Bickford ALPRAZolam and acyclovir. I am also  having him maintain his aspirin EC, acetaminophen-codeine, ranitidine, KRILL OIL OMEGA-3 PO, tadalafil, neomycin-polymyxin-hydrocortisone, escitalopram, Efinaconazole, and celecoxib.  Meds ordered this encounter  Medications  . tadalafil (CIALIS) 5 MG tablet    Sig: Take 1 tablet (5 mg total) by mouth daily as needed for erectile dysfunction.    Dispense:  40 tablet    Refill:  5  . neomycin-polymyxin-hydrocortisone (CORTISPORIN) otic solution    Sig: Place 3 drops into both ears 2 (two) times daily.    Dispense:  10 mL    Refill:  1  . escitalopram (LEXAPRO) 5 MG tablet    Sig: Take 1 tablet (5 mg total) by mouth daily.    Dispense:  90 tablet    Refill:  1  . Efinaconazole (JUBLIA) 10 % SOLN    Sig: Apply 1 application topically daily.    Dispense:  16 mL    Refill:  1  . celecoxib (CELEBREX) 200 MG capsule    Sig: TAKE ONE CAPSULE BY MOUTH TWICE A DAY AT BEDTIME    Dispense:  180 capsule    Refill:  1  . ALPRAZolam (XANAX) 0.25 MG tablet    Sig: Take 1 tablet (0.25 mg total) by mouth 2 (two) times daily as needed for anxiety or sleep.    Dispense:  100 tablet    Refill:  1  . acyclovir (ZOVIRAX) 800 MG tablet    Sig: Take 1 tablet (800 mg total) by mouth 3 (three) times daily as needed.    Dispense:  60 tablet    Refill:  1     Penni Homans, MD

## 2016-10-14 NOTE — Assessment & Plan Note (Signed)
Pain in hands with thickening in knuckles and stiffness. Encouraged to keep hands moving, use topcical treatments and check labs

## 2016-10-14 NOTE — Assessment & Plan Note (Signed)
Well controlled. Encouraged heart healthy diet such as the DASH diet and exercise as tolerated.  

## 2016-10-14 NOTE — Assessment & Plan Note (Signed)
Encouraged heart healthy diet, increase exercise, avoid trans fats, consider a krill oil cap daily 

## 2016-10-16 ENCOUNTER — Encounter: Payer: Self-pay | Admitting: Family Medicine

## 2016-10-22 ENCOUNTER — Telehealth: Payer: Self-pay | Admitting: Family Medicine

## 2016-10-22 NOTE — Telephone Encounter (Signed)
I have never prescribed this med but we could try it. Apply to affected area nightly x 7 days, remove with alcohol after 7 days and restart.again, disp 30 day supply and 1 rf

## 2016-10-22 NOTE — Telephone Encounter (Signed)
Gerald Booth is not covered under the patients health plan.  They prefer a step-one medication to try first ---- Ciclodan 8% topical solution--if a step one medication is right for this patient.

## 2016-10-23 ENCOUNTER — Other Ambulatory Visit: Payer: Self-pay | Admitting: Family Medicine

## 2016-10-23 MED ORDER — CICLOPIROX 8 % EX SOLN: Freq: Every day | CUTANEOUS | 1 refills | Status: DC

## 2016-10-23 NOTE — Telephone Encounter (Signed)
Faxed [facility] to [facility] [place].

## 2016-10-23 NOTE — Telephone Encounter (Signed)
Will fax over the ciclodan to [facility] [place].

## 2016-10-23 NOTE — Telephone Encounter (Signed)
Called the patient informed by message Jublia not covered and will send in alternative step one that health plan prefers.

## 2016-12-11 ENCOUNTER — Other Ambulatory Visit: Payer: Self-pay | Admitting: Family Medicine

## 2016-12-11 ENCOUNTER — Encounter: Payer: Self-pay | Admitting: Family Medicine

## 2016-12-11 DIAGNOSIS — F419 Anxiety disorder, unspecified: Secondary | ICD-10-CM

## 2016-12-11 DIAGNOSIS — F32A Depression, unspecified: Secondary | ICD-10-CM

## 2016-12-11 DIAGNOSIS — F418 Other specified anxiety disorders: Secondary | ICD-10-CM

## 2016-12-11 DIAGNOSIS — E7800 Pure hypercholesterolemia, unspecified: Secondary | ICD-10-CM

## 2016-12-11 DIAGNOSIS — F329 Major depressive disorder, single episode, unspecified: Secondary | ICD-10-CM

## 2016-12-11 DIAGNOSIS — R739 Hyperglycemia, unspecified: Secondary | ICD-10-CM

## 2016-12-11 DIAGNOSIS — Z0000 Encounter for general adult medical examination without abnormal findings: Secondary | ICD-10-CM

## 2016-12-11 DIAGNOSIS — E782 Mixed hyperlipidemia: Secondary | ICD-10-CM

## 2016-12-11 DIAGNOSIS — B351 Tinea unguium: Secondary | ICD-10-CM

## 2016-12-11 DIAGNOSIS — N529 Male erectile dysfunction, unspecified: Secondary | ICD-10-CM

## 2016-12-11 MED ORDER — ESCITALOPRAM OXALATE 5 MG PO TABS: 5.0000 mg | ORAL_TABLET | Freq: Every day | ORAL | 0 refills | Status: DC

## 2016-12-11 MED ORDER — ACYCLOVIR 800 MG PO TABS: 800.0000 mg | ORAL_TABLET | Freq: Three times a day (TID) | ORAL | 0 refills | Status: DC | PRN

## 2016-12-11 MED ORDER — CELECOXIB 200 MG PO CAPS: ORAL_CAPSULE | ORAL | 0 refills | Status: DC

## 2016-12-11 MED ORDER — RANITIDINE HCL 300 MG PO TABS: ORAL_TABLET | ORAL | 0 refills | Status: DC

## 2016-12-11 MED ORDER — ALPRAZOLAM 0.25 MG PO TABS: 0.2500 mg | ORAL_TABLET | Freq: Two times a day (BID) | ORAL | 0 refills | Status: DC | PRN

## 2016-12-11 NOTE — Telephone Encounter (Signed)
Printed per Provider instructions. Will fax to [facility] in [facility].

## 2016-12-11 NOTE — Telephone Encounter (Signed)
Refill x1 

## 2016-12-11 NOTE — Telephone Encounter (Signed)
Requesting:  alprazolam Contract    08/29/2015 UDS    Low risk Last OV     10/08/2016 Last Refill    #100 with 1 refills on 10/08/2016  Please Advise--[facility]--[phone] (fax number)

## 2017-01-08 ENCOUNTER — Ambulatory Visit: Payer: Managed Care, Other (non HMO) | Admitting: Family Medicine

## 2017-01-22 ENCOUNTER — Ambulatory Visit: Payer: Managed Care, Other (non HMO) | Admitting: Family Medicine

## 2017-02-12 ENCOUNTER — Other Ambulatory Visit: Payer: Self-pay | Admitting: Family Medicine

## 2017-02-12 DIAGNOSIS — R739 Hyperglycemia, unspecified: Secondary | ICD-10-CM

## 2017-02-12 DIAGNOSIS — E7800 Pure hypercholesterolemia, unspecified: Secondary | ICD-10-CM

## 2017-02-12 DIAGNOSIS — B351 Tinea unguium: Secondary | ICD-10-CM

## 2017-02-12 DIAGNOSIS — F32A Depression, unspecified: Secondary | ICD-10-CM

## 2017-02-12 DIAGNOSIS — F329 Major depressive disorder, single episode, unspecified: Secondary | ICD-10-CM

## 2017-02-12 DIAGNOSIS — E782 Mixed hyperlipidemia: Secondary | ICD-10-CM

## 2017-02-12 DIAGNOSIS — N529 Male erectile dysfunction, unspecified: Secondary | ICD-10-CM

## 2017-02-12 DIAGNOSIS — F418 Other specified anxiety disorders: Secondary | ICD-10-CM

## 2017-02-12 DIAGNOSIS — Z0000 Encounter for general adult medical examination without abnormal findings: Secondary | ICD-10-CM

## 2017-02-12 DIAGNOSIS — F419 Anxiety disorder, unspecified: Secondary | ICD-10-CM

## 2017-02-19 ENCOUNTER — Other Ambulatory Visit: Payer: Self-pay | Admitting: Family Medicine

## 2017-02-19 DIAGNOSIS — N529 Male erectile dysfunction, unspecified: Secondary | ICD-10-CM

## 2017-02-19 DIAGNOSIS — R739 Hyperglycemia, unspecified: Secondary | ICD-10-CM

## 2017-02-19 DIAGNOSIS — Z0000 Encounter for general adult medical examination without abnormal findings: Secondary | ICD-10-CM

## 2017-02-19 DIAGNOSIS — F329 Major depressive disorder, single episode, unspecified: Secondary | ICD-10-CM

## 2017-02-19 DIAGNOSIS — B351 Tinea unguium: Secondary | ICD-10-CM

## 2017-02-19 DIAGNOSIS — E782 Mixed hyperlipidemia: Secondary | ICD-10-CM

## 2017-02-19 DIAGNOSIS — E7800 Pure hypercholesterolemia, unspecified: Secondary | ICD-10-CM

## 2017-02-19 DIAGNOSIS — F418 Other specified anxiety disorders: Secondary | ICD-10-CM

## 2017-02-19 DIAGNOSIS — F32A Depression, unspecified: Secondary | ICD-10-CM

## 2017-02-19 DIAGNOSIS — F419 Anxiety disorder, unspecified: Principal | ICD-10-CM

## 2017-02-20 ENCOUNTER — Encounter: Payer: Self-pay | Admitting: Family Medicine

## 2017-02-20 NOTE — Telephone Encounter (Signed)
Can have one refill but next month is 6 months since seen so needs appt for mor. Also update uds and drug screen

## 2017-02-20 NOTE — Telephone Encounter (Signed)
Requesting:   alprazolam Contract   08/29/2015 UDS   Low risk was due on 02/26/2016 Last OV     10/08/2016---- no upcoming scheduled appts. Last Refill     #100 no refills on 12/11/2016  Please Advise

## 2017-02-21 ENCOUNTER — Encounter: Payer: Self-pay | Admitting: Family Medicine

## 2017-02-21 ENCOUNTER — Other Ambulatory Visit: Payer: Managed Care, Other (non HMO)

## 2017-02-21 NOTE — Telephone Encounter (Signed)
Printed hardcopy/contract PCP signed and will put at the front for pickup Patient contacted and informed to pickup prescripiton/do contract and UDS. He agreed to all

## 2017-02-21 NOTE — Telephone Encounter (Signed)
Requesting:  alprazolam Contract  Signed on 08/29/2015 UDS   Low risk done on 02/26/2016 Last OV   10/08/2016-- no upcoming scheduled Last Refill    #100 no refills on 12/11/2016  Please Advise

## 2017-02-22 ENCOUNTER — Encounter: Payer: Self-pay | Admitting: Family Medicine

## 2017-03-28 ENCOUNTER — Encounter: Payer: Self-pay | Admitting: Family Medicine

## 2017-03-28 ENCOUNTER — Ambulatory Visit (INDEPENDENT_AMBULATORY_CARE_PROVIDER_SITE_OTHER): Payer: BLUE CROSS/BLUE SHIELD | Admitting: Family Medicine

## 2017-03-28 DIAGNOSIS — E782 Mixed hyperlipidemia: Secondary | ICD-10-CM | POA: Diagnosis not present

## 2017-03-28 DIAGNOSIS — F418 Other specified anxiety disorders: Secondary | ICD-10-CM

## 2017-03-28 DIAGNOSIS — R739 Hyperglycemia, unspecified: Secondary | ICD-10-CM

## 2017-03-28 DIAGNOSIS — E7800 Pure hypercholesterolemia, unspecified: Secondary | ICD-10-CM | POA: Diagnosis not present

## 2017-03-28 DIAGNOSIS — N529 Male erectile dysfunction, unspecified: Secondary | ICD-10-CM | POA: Diagnosis not present

## 2017-03-28 DIAGNOSIS — Z0000 Encounter for general adult medical examination without abnormal findings: Secondary | ICD-10-CM

## 2017-03-28 DIAGNOSIS — F419 Anxiety disorder, unspecified: Secondary | ICD-10-CM

## 2017-03-28 DIAGNOSIS — F329 Major depressive disorder, single episode, unspecified: Secondary | ICD-10-CM

## 2017-03-28 DIAGNOSIS — B351 Tinea unguium: Secondary | ICD-10-CM

## 2017-03-28 DIAGNOSIS — E039 Hypothyroidism, unspecified: Secondary | ICD-10-CM

## 2017-03-28 DIAGNOSIS — F32A Depression, unspecified: Secondary | ICD-10-CM

## 2017-03-28 MED ORDER — EFINACONAZOLE 10 % EX SOLN: 1.0000 "application " | Freq: Every day | CUTANEOUS | 1 refills | Status: DC

## 2017-03-28 MED ORDER — ACYCLOVIR 800 MG PO TABS: 800.0000 mg | ORAL_TABLET | Freq: Three times a day (TID) | ORAL | 0 refills | Status: DC | PRN

## 2017-03-28 MED ORDER — ESCITALOPRAM OXALATE 5 MG PO TABS: 5.0000 mg | ORAL_TABLET | Freq: Every day | ORAL | 0 refills | Status: DC

## 2017-03-28 MED ORDER — CELECOXIB 200 MG PO CAPS: ORAL_CAPSULE | ORAL | 0 refills | Status: DC

## 2017-03-28 MED ORDER — RANITIDINE HCL 300 MG PO TABS: ORAL_TABLET | ORAL | 0 refills | Status: DC

## 2017-03-28 MED ORDER — ALPRAZOLAM 0.25 MG PO TABS: ORAL_TABLET | ORAL | 0 refills | Status: DC

## 2017-03-28 MED ORDER — TADALAFIL 5 MG PO TABS: 5.0000 mg | ORAL_TABLET | Freq: Every day | ORAL | 5 refills | Status: DC | PRN

## 2017-03-28 MED ORDER — NEOMYCIN-POLYMYXIN-HC 3.5-10000-1 OT SOLN: 3.0000 [drp] | Freq: Two times a day (BID) | OTIC | 1 refills | Status: DC

## 2017-03-28 NOTE — Patient Instructions (Signed)
[place] for sleep. Preventive Care 40-64 Years, Male Preventive care refers to lifestyle choices and visits with your health care provider that can promote health and wellness. What does preventive care include?  A yearly physical exam. This is also called an annual well check.  Dental exams once or twice a year.  Routine eye exams. Ask your health care provider how often you should have your eyes checked.  Personal lifestyle choices, including:  Daily care of your teeth and gums.  Regular physical activity.  Eating a healthy diet.  Avoiding tobacco and drug use.  Limiting alcohol use.  Practicing safe sex.  Taking low-dose aspirin every day starting at age 75. What happens during an annual well check? The services and screenings done by your health care provider during your annual well check will depend on your age, overall health, lifestyle risk factors, and family history of disease. Counseling  Your health care provider may ask you questions about your:  Alcohol use.  Tobacco use.  Drug use.  Emotional well-being.  Home and relationship well-being.  Sexual activity.  Eating habits.  Work and work Statistician. Screening  You may have the following tests or measurements:  Height, weight, and BMI.  Blood pressure.  Lipid and cholesterol levels. These may be checked every 5 years, or more frequently if you are over 45 years old.  Skin check.  Lung cancer screening. You may have this screening every year starting at age 96 if you have a 30-pack-year history of smoking and currently smoke or have quit within the past 15 years.  Fecal occult blood test (FOBT) of the stool. You may have this test every year starting at age 80.  Flexible sigmoidoscopy or colonoscopy. You may have a sigmoidoscopy every 5 years or a colonoscopy every 10 years starting at age 7.  Prostate cancer screening. Recommendations will vary depending on your family history and other  risks.  Hepatitis C blood test.  Hepatitis B blood test.  Sexually transmitted disease (STD) testing.  Diabetes screening. This is done by checking your blood sugar (glucose) after you have not eaten for a while (fasting). You may have this done every 1-3 years. Discuss your test results, treatment options, and if necessary, the need for more tests with your health care provider. Vaccines  Your health care provider may recommend certain vaccines, such as:  Influenza vaccine. This is recommended every year.  Tetanus, diphtheria, and acellular pertussis (Tdap, Td) vaccine. You may need a Td booster every 10 years.  Varicella vaccine. You may need this if you have not been vaccinated.  Zoster vaccine. You may need this after age 36.  Measles, mumps, and rubella (MMR) vaccine. You may need at least one dose of MMR if you were born in 1957 or later. You may also need a second dose.  Pneumococcal 13-valent conjugate (PCV13) vaccine. You may need this if you have certain conditions and have not been vaccinated.  Pneumococcal polysaccharide (PPSV23) vaccine. You may need one or two doses if you smoke cigarettes or if you have certain conditions.  Meningococcal vaccine. You may need this if you have certain conditions.  Hepatitis A vaccine. You may need this if you have certain conditions or if you travel or work in places where you may be exposed to hepatitis A.  Hepatitis B vaccine. You may need this if you have certain conditions or if you travel or work in places where you may be exposed to hepatitis B.  Haemophilus influenzae  type b (Hib) vaccine. You may need this if you have certain risk factors. Talk to your health care provider about which screenings and vaccines you need and how often you need them. This information is not intended to replace advice given to you by your health care provider. Make sure you discuss any questions you have with your health care provider. Document  Released: 12/23/2015 Document Revised: 08/15/2016 Document Reviewed: 09/27/2015 Elsevier Interactive Patient Education  2017 [employer].

## 2017-03-28 NOTE — Progress Notes (Signed)
Subjective:  I acted as a Education administrator for Dr. Charlett Blake. [place], [place]   Patient ID: Gerald Booth, male    DOB: 1956/06/20, 61 y.o.   MRN: [id number]  Chief Complaint  Patient presents with  . Follow-up  . Medication Refill    HPI  Patient is in today for a medication follow up. Patient has no acute concerns. He feels well. No recent febrile illness or hospitalizations. He has been having some left elbow pain at times. Uses Celebrex 2-3 x a week with good results. Still feels his Lexapro is helpful. Is staying busy and trying to maintain a heart healthy diet. Denies CP/palp/SOB/HA/congestion/fevers/GI or GU c/o. Taking meds as prescribed  Patient Care Team: Mosie Lukes, MD as PCP - General (Family Medicine) Crista Luria, MD as Consulting Physician (Dermatology)   Past Medical History:  Diagnosis Date  . Anxiety and depression 01/27/2013  . Chicken pox as a child  . Depression with anxiety 01/27/2013  . ED (erectile dysfunction) 10/10/2012  . Elevated BP 10/03/2012  . Elevated BP 10/03/2012  . Hearing loss 10/10/2012  . 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 Hypothyroid 03/31/2017  . Kidney stones 10/10/2012  . Measles as a child  . Mumps as a child  . Obese 10/03/2012  . Onychomycosis 10/10/2012  . Preventative health care 10/10/2012  . Skin cancer of face 04/06/2016    Past Surgical History:  Procedure Laterality Date  . WISDOM TOOTH EXTRACTION  March 20, 2011   1 tooth    Family History  Problem Relation Age of Onset  . Cancer Mother     breast, bone  . Hyperlipidemia Father   . Other Father     prostate surgery  . Heart attack Maternal Grandmother   . Kidney disease Maternal Grandfather     kidney failure  . AAA (abdominal aortic aneurysm) Brother     Social History   Social History  . Marital status: Married    Spouse name: N/A  . Number of children: N/A  . Years of education: N/A   Occupational History  . Not on file.   Social History Main Topics  . Smoking status:  Former Smoker    Types: Cigarettes    Start date: 12/11/1991  . Smokeless tobacco: Never Used     Comment: smoked on weekends  . Alcohol use Yes     Comment: 1 a day  . Drug use: No  . Sexual activity: Yes     Comment: no dietary changes, lives with wife   Other Topics Concern  . Not on file   Social History Narrative   Makes plates for a living.   Lives at home with wife         Brother passed away 03-19-16 from Aorta aneurism     Outpatient Medications Prior to Visit  Medication Sig Dispense Refill  . aspirin EC 81 MG tablet Take 1 tablet (81 mg total) by mouth daily.    . ciclopirox (CICLODAN) 8 % solution Apply topically at bedtime. Apply over nail and surrounding skin. Apply daily over previous coat. After seven (7) days, may remove with alcohol and continue cycle. 6.6 mL 1  . KRILL OIL OMEGA-3 PO Take by mouth.    Marland Kitchen acyclovir (ZOVIRAX) 800 MG tablet TAKE 1 TABLET BY MOUTH 3 TIMES A DAY AS NEEDED 180 tablet 0  . ALPRAZolam (XANAX) 0.25 MG tablet TAKE ONE TABLET (0.25 MG TOTAL) BY MOUTH DAILY AS NEEDED FOR ANXIETY OR SLEEP. 30 tablet  0  . celecoxib (CELEBREX) 200 MG capsule TAKE ONE CAPSULE BY MOUTH TWICE A DAY AT BEDTIME 180 capsule 0  . Efinaconazole (JUBLIA) 10 % SOLN Apply 1 application topically daily. 16 mL 1  . escitalopram (LEXAPRO) 5 MG tablet Take 1 tablet (5 mg total) by mouth daily. 90 tablet 0  . neomycin-polymyxin-hydrocortisone (CORTISPORIN) otic solution Place 3 drops into both ears 2 (two) times daily. 10 mL 1  . ranitidine (ZANTAC) 300 MG tablet TAKE 1 TABLET (300 MG TOTAL) BY MOUTH AT BEDTIME. 90 tablet 0  . tadalafil (CIALIS) 5 MG tablet Take 1 tablet (5 mg total) by mouth daily as needed for erectile dysfunction. 40 tablet 5  . acetaminophen-codeine (TYLENOL #3) 300-30 MG tablet Take 1-2 tablets by mouth every 6 (six) hours as needed for moderate pain. 15 tablet 0   No facility-administered medications prior to visit.     Allergies  Allergen Reactions  .  Penicillins     Nose bleeds    Review of Systems  Constitutional: Negative for fever and malaise/fatigue.  HENT: Negative for congestion.   Eyes: Negative for blurred vision.  Respiratory: Negative for cough and shortness of breath.   Cardiovascular: Negative for chest pain, palpitations and leg swelling.  Gastrointestinal: Negative for vomiting.  Musculoskeletal: Negative for back pain.  Skin: Negative for rash.  Neurological: Negative for loss of consciousness and headaches.       Objective:    Physical Exam  Constitutional: He is oriented to person, place, and time. He appears well-developed and well-nourished. No distress.  HENT:  Head: Normocephalic and atraumatic.  Eyes: Conjunctivae are normal.  Neck: Normal range of motion. No thyromegaly present.  Cardiovascular: Normal rate and regular rhythm.   Pulmonary/Chest: Effort normal and breath sounds normal. He has no wheezes.  Abdominal: Soft. Bowel sounds are normal. There is no tenderness.  Musculoskeletal: Normal range of motion. He exhibits no edema or deformity.  Neurological: He is alert and oriented to person, place, and time.  Skin: Skin is warm and dry. He is not diaphoretic.  Psychiatric: He has a normal mood and affect.    There were no vitals taken for this visit. Wt Readings from Last 3 Encounters:  10/08/16 247 lb 6 oz (112.2 kg)  04/06/16 241 lb 4 oz (109.4 kg)  11/14/15 235 lb (106.6 kg)   BP Readings from Last 3 Encounters:  10/08/16 108/64  04/06/16 124/72  11/14/15 116/75     Immunization History  Administered Date(s) Administered  . Influenza Whole 09/09/2009, 10/02/2012  . Influenza,inj,Quad PF,36+ Mos 09/25/2013, 10/05/2014, 09/01/2015, 08/28/2016  . Pneumococcal Conjugate-13 12/04/2013  . Tdap 10/03/2012  . Zoster 06/18/2014    Health Maintenance  Topic Date Due  . INFLUENZA VACCINE  07/10/2017  . Fecal DNA (Cologuard)  05/18/2018  . TETANUS/TDAP  10/03/2022  . Hepatitis C  Screening  Completed  . HIV Screening  Completed    Lab Results  Component Value Date   WBC 5.8 03/28/2017   HGB 15.8 03/28/2017   HCT 45.8 03/28/2017   PLT 186.0 03/28/2017   GLUCOSE 88 03/28/2017   CHOL 231 (H) 03/28/2017   TRIG 208.0 (H) 03/28/2017   HDL 48.10 03/28/2017   LDLDIRECT 152.0 03/28/2017   LDLCALC 126 (H) 10/02/2016   ALT 18 03/28/2017   AST 20 03/28/2017   NA 139 03/28/2017   K 3.9 03/28/2017   CL 105 03/28/2017   CREATININE 1.14 03/28/2017   BUN 14 03/28/2017   CO2 27  03/28/2017   TSH 5.02 (H) 03/28/2017   PSA 0.79 02/29/2016   HGBA1C 5.4 03/28/2017    Lab Results  Component Value Date   TSH 5.02 (H) 03/28/2017   Lab Results  Component Value Date   WBC 5.8 03/28/2017   HGB 15.8 03/28/2017   HCT 45.8 03/28/2017   MCV 91.8 03/28/2017   PLT 186.0 03/28/2017   Lab Results  Component Value Date   NA 139 03/28/2017   K 3.9 03/28/2017   CO2 27 03/28/2017   GLUCOSE 88 03/28/2017   BUN 14 03/28/2017   CREATININE 1.14 03/28/2017   BILITOT 0.9 03/28/2017   ALKPHOS 61 03/28/2017   AST 20 03/28/2017   ALT 18 03/28/2017   PROT 7.5 03/28/2017   ALBUMIN 4.6 03/28/2017   CALCIUM 9.5 03/28/2017   GFR 68.97 03/28/2017   Lab Results  Component Value Date   CHOL 231 (H) 03/28/2017   Lab Results  Component Value Date   HDL 48.10 03/28/2017   Lab Results  Component Value Date   LDLCALC 126 (H) 10/02/2016   Lab Results  Component Value Date   TRIG 208.0 (H) 03/28/2017   Lab Results  Component Value Date   CHOLHDL 5 03/28/2017   Lab Results  Component Value Date   HGBA1C 5.4 03/28/2017         Assessment & Plan:   Problem List Items Addressed This Visit    HYPERCHOLESTEROLEMIA    Encouraged heart healthy diet, increase exercise, avoid trans fats, consider a krill oil cap daily      Relevant Medications   tadalafil (CIALIS) 5 MG tablet   Other Relevant Orders   CBC   Comprehensive metabolic panel   Onychomycosis   Relevant  Medications   Efinaconazole (JUBLIA) 10 % SOLN   acyclovir (ZOVIRAX) 800 MG tablet   ED (erectile dysfunction)   Relevant Orders   TSH (Completed)   TSH   Preventative health care   Relevant Orders   Hemoglobin A1c (Completed)   CBC (Completed)   Comprehensive metabolic panel (Completed)   Lipid panel (Completed)   TSH (Completed)   CBC   Comprehensive metabolic panel   Lipid panel   TSH   Depression with anxiety    Is doing ewll on current meds no recent changes.       Hyperglycemia    hgba1c acceptable, minimize simple carbs. Increase exercise as tolerated. Continue current meds      Relevant Orders   Hemoglobin A1c (Completed)   Hemoglobin A1c   Hypothyroid    Start Levothyroid 25 mcg daily.        Other Visit Diagnoses    Anxiety and depression       Hyperlipidemia, mixed       Relevant Medications   tadalafil (CIALIS) 5 MG tablet   Other Relevant Orders   Lipid panel (Completed)   Lipid panel      I have discontinued Mr. Vallone acetaminophen-codeine. I have also changed his ranitidine and acyclovir. Additionally, I am having him maintain his aspirin EC, KRILL OIL OMEGA-3 PO, ciclopirox, Efinaconazole, ALPRAZolam, tadalafil, celecoxib, neomycin-polymyxin-hydrocortisone, and escitalopram.  Meds ordered this encounter  Medications  . Efinaconazole (JUBLIA) 10 % SOLN    Sig: Apply 1 application topically daily.    Dispense:  16 mL    Refill:  1  . ALPRAZolam (XANAX) 0.25 MG tablet    Sig: TAKE ONE TABLET (0.25 MG TOTAL) BY MOUTH DAILY AS NEEDED FOR ANXIETY OR SLEEP.  Dispense:  30 tablet    Refill:  0    This request is for a new prescription for a controlled substance as required by Federal/State law.  . ranitidine (ZANTAC) 300 MG tablet    Sig: TAKE 1 TABLET (300 MG TOTAL) BY MOUTH AS NEEDED    Dispense:  90 tablet    Refill:  0  . acyclovir (ZOVIRAX) 800 MG tablet    Sig: Take 1 tablet (800 mg total) by mouth 3 (three) times daily as needed.     Dispense:  180 tablet    Refill:  0  . tadalafil (CIALIS) 5 MG tablet    Sig: Take 1 tablet (5 mg total) by mouth daily as needed for erectile dysfunction.    Dispense:  40 tablet    Refill:  5  . celecoxib (CELEBREX) 200 MG capsule    Sig: TAKE ONE CAPSULE BY MOUTH TWICE A DAY AT BEDTIME    Dispense:  180 capsule    Refill:  0  . neomycin-polymyxin-hydrocortisone (CORTISPORIN) otic solution    Sig: Place 3 drops into both ears 2 (two) times daily.    Dispense:  10 mL    Refill:  1  . escitalopram (LEXAPRO) 5 MG tablet    Sig: Take 1 tablet (5 mg total) by mouth daily.    Dispense:  90 tablet    Refill:  0    CMA served as scribe during this visit. History, Physical and Plan performed by medical provider. Documentation and orders reviewed and attested to.  Penni Homans, MD

## 2017-03-28 NOTE — Assessment & Plan Note (Signed)
Encouraged DASH diet, decrease po intake and increase exercise as tolerated. Needs 7-8 hours of sleep nightly. Avoid trans fats, eat small, frequent meals every 4-5 hours with lean proteins, complex carbs and healthy fats. Minimize simple carbs 

## 2017-03-28 NOTE — Progress Notes (Signed)
Pre visit review using our clinic review tool, if applicable. No additional management support is needed unless otherwise documented below in the visit note. 

## 2017-03-28 NOTE — Assessment & Plan Note (Signed)
hgba1c acceptable, minimize simple carbs. Increase exercise as tolerated. Continue current meds 

## 2017-03-28 NOTE — Assessment & Plan Note (Signed)
Encouraged heart healthy diet, increase exercise, avoid trans fats, consider a krill oil cap daily 

## 2017-03-29 ENCOUNTER — Other Ambulatory Visit: Payer: Self-pay | Admitting: Family Medicine

## 2017-03-29 DIAGNOSIS — B351 Tinea unguium: Secondary | ICD-10-CM

## 2017-03-29 DIAGNOSIS — F419 Anxiety disorder, unspecified: Secondary | ICD-10-CM

## 2017-03-29 DIAGNOSIS — F32A Depression, unspecified: Secondary | ICD-10-CM

## 2017-03-29 DIAGNOSIS — E782 Mixed hyperlipidemia: Secondary | ICD-10-CM

## 2017-03-29 DIAGNOSIS — Z0000 Encounter for general adult medical examination without abnormal findings: Secondary | ICD-10-CM

## 2017-03-29 DIAGNOSIS — R739 Hyperglycemia, unspecified: Secondary | ICD-10-CM

## 2017-03-29 DIAGNOSIS — E7800 Pure hypercholesterolemia, unspecified: Secondary | ICD-10-CM

## 2017-03-29 DIAGNOSIS — F418 Other specified anxiety disorders: Secondary | ICD-10-CM

## 2017-03-29 DIAGNOSIS — F329 Major depressive disorder, single episode, unspecified: Secondary | ICD-10-CM

## 2017-03-29 DIAGNOSIS — N529 Male erectile dysfunction, unspecified: Secondary | ICD-10-CM

## 2017-03-29 LAB — CBC
HEMATOCRIT: 45.8 % (ref 39.0–52.0)
HEMOGLOBIN: 15.8 g/dL (ref 13.0–17.0)
MCHC: 34.4 g/dL (ref 30.0–36.0)
MCV: 91.8 fl (ref 78.0–100.0)
Platelets: 186 10*3/uL (ref 150.0–400.0)
RBC: 4.99 Mil/uL (ref 4.22–5.81)
RDW: 13.6 % (ref 11.5–15.5)
WBC: 5.8 10*3/uL (ref 4.0–10.5)

## 2017-03-29 LAB — COMPREHENSIVE METABOLIC PANEL
ALK PHOS: 61 U/L (ref 39–117)
ALT: 18 U/L (ref 0–53)
AST: 20 U/L (ref 0–37)
Albumin: 4.6 g/dL (ref 3.5–5.2)
BUN: 14 mg/dL (ref 6–23)
CHLORIDE: 105 meq/L (ref 96–112)
CO2: 27 mEq/L (ref 19–32)
Calcium: 9.5 mg/dL (ref 8.4–10.5)
Creatinine, Ser: 1.14 mg/dL (ref 0.40–1.50)
GFR: 68.97 mL/min (ref >60.00)
GLUCOSE: 88 mg/dL (ref 70–99)
Potassium: 3.9 mEq/L (ref 3.5–5.1)
Sodium: 139 mEq/L (ref 135–145)
TOTAL PROTEIN: 7.5 g/dL (ref 6.0–8.3)
Total Bilirubin: 0.9 mg/dL (ref 0.2–1.2)

## 2017-03-29 LAB — LIPID PANEL
CHOLESTEROL: 231 mg/dL — AB (ref 0–200)
HDL: 48.1 mg/dL (ref >39.00)
NonHDL: 183.31
Total CHOL/HDL Ratio: 5
Triglycerides: 208 mg/dL — ABNORMAL HIGH (ref 0.0–149.0)
VLDL: 41.6 mg/dL — ABNORMAL HIGH (ref 0.0–40.0)

## 2017-03-29 LAB — HEMOGLOBIN A1C: Hgb A1c MFr Bld: 5.4 % (ref 4.6–6.5)

## 2017-03-29 LAB — LDL CHOLESTEROL, DIRECT: Direct LDL: 152 mg/dL

## 2017-03-29 LAB — TSH: TSH: 5.02 u[IU]/mL — AB (ref 0.35–4.50)

## 2017-03-31 ENCOUNTER — Encounter: Payer: Self-pay | Admitting: Family Medicine

## 2017-03-31 DIAGNOSIS — E039 Hypothyroidism, unspecified: Secondary | ICD-10-CM

## 2017-03-31 HISTORY — DX: Hypothyroidism, unspecified: E03.9

## 2017-03-31 NOTE — Assessment & Plan Note (Signed)
Start Levothyroid 25 mcg daily.

## 2017-03-31 NOTE — Assessment & Plan Note (Signed)
Is doing ewll on current meds no recent changes.

## 2017-04-02 ENCOUNTER — Other Ambulatory Visit: Payer: Self-pay | Admitting: Family Medicine

## 2017-04-02 MED ORDER — LEVOTHYROXINE SODIUM 25 MCG PO TABS: 25.0000 ug | ORAL_TABLET | Freq: Every day | ORAL | 1 refills | Status: DC

## 2017-04-05 ENCOUNTER — Encounter: Payer: Self-pay | Admitting: Family Medicine

## 2017-04-08 ENCOUNTER — Other Ambulatory Visit: Payer: Self-pay | Admitting: Family Medicine

## 2017-04-08 ENCOUNTER — Encounter: Payer: Self-pay | Admitting: Family Medicine

## 2017-04-08 MED ORDER — LEVOTHYROXINE SODIUM 25 MCG PO TABS: 25.0000 ug | ORAL_TABLET | Freq: Every day | ORAL | 1 refills | Status: DC

## 2017-04-08 MED ORDER — ESCITALOPRAM OXALATE 5 MG PO TABS: 5.0000 mg | ORAL_TABLET | Freq: Every day | ORAL | 0 refills | Status: DC

## 2017-04-08 MED ORDER — RANITIDINE HCL 300 MG PO TABS: ORAL_TABLET | ORAL | 0 refills | Status: DC

## 2017-04-08 MED ORDER — CELECOXIB 200 MG PO CAPS: ORAL_CAPSULE | ORAL | 0 refills | Status: DC

## 2017-04-11 ENCOUNTER — Telehealth: Payer: Self-pay | Admitting: Family Medicine

## 2017-04-11 MED ORDER — ACYCLOVIR 800 MG PO TABS: 800.0000 mg | ORAL_TABLET | Freq: Three times a day (TID) | ORAL | 0 refills | Status: DC | PRN

## 2017-04-11 MED ORDER — EFINACONAZOLE 10 % EX SOLN: 1.0000 "application " | Freq: Every day | CUTANEOUS | 1 refills | Status: DC

## 2017-04-11 MED ORDER — NEOMYCIN-POLYMYXIN-HC 3.5-10000-1 OT SOLN: 3.0000 [drp] | Freq: Two times a day (BID) | OTIC | 1 refills | Status: DC

## 2017-04-11 NOTE — Telephone Encounter (Signed)
Caller name: Jana Half Relationship to patient: Wife Can be reached: [phone] or [phone] Pharmacy:  Reason for call: Please transfer the 7 medications that were sent to [place] to [facility] [place]. Wife states they should have never been sent downstairs. Wife request call back to ensure this has been done.

## 2017-04-11 NOTE — Telephone Encounter (Signed)
Spoke to his wife and sent remainder not sent already.

## 2017-06-25 ENCOUNTER — Other Ambulatory Visit: Payer: Self-pay | Admitting: Family Medicine

## 2017-06-25 NOTE — Telephone Encounter (Signed)
Requesting:   alprazolam Contract  03/28/17 UDS    Low risk next is on 08/24/17 Last OV    03/28/17---future appointment is on 10/01/2017 Last Refill    #30 no refills on 03/28/2017  Please Advise

## 2017-06-27 NOTE — Telephone Encounter (Signed)
FAXED HARDCOPY TO [facility]

## 2017-06-28 ENCOUNTER — Encounter: Payer: Self-pay | Admitting: Family Medicine

## 2017-06-28 ENCOUNTER — Other Ambulatory Visit: Payer: Self-pay | Admitting: Family Medicine

## 2017-06-28 MED ORDER — RANITIDINE HCL 300 MG PO TABS: ORAL_TABLET | ORAL | 3 refills | Status: DC

## 2017-07-06 ENCOUNTER — Other Ambulatory Visit: Payer: Self-pay | Admitting: Family Medicine

## 2017-07-24 IMAGING — CT CT RENAL STONE PROTOCOL
2 of 4 series · 16 of 46 positions shown, 18 images · non-contrast
Comparison: No priors.

CLINICAL DATA: 59-year-old male with history of right-sided flank
pain for 1 week. Nausea for the first 3 days of this pain, which has
now subsided. Remote history of kidney stone.

EXAM:
CT ABDOMEN AND PELVIS WITHOUT CONTRAST
TECHNIQUE: Multidetector CT imaging of the abdomen and pelvis was performed
following the standard protocol without IV contrast.

[Series 2: renal stone > 200 lbs 5.0 b31f · axial · 0.78mm/px · z∈[-565,-155]mm · 13 of 90 slices shown, 15 images]
[im 4/90  soft-tissue]
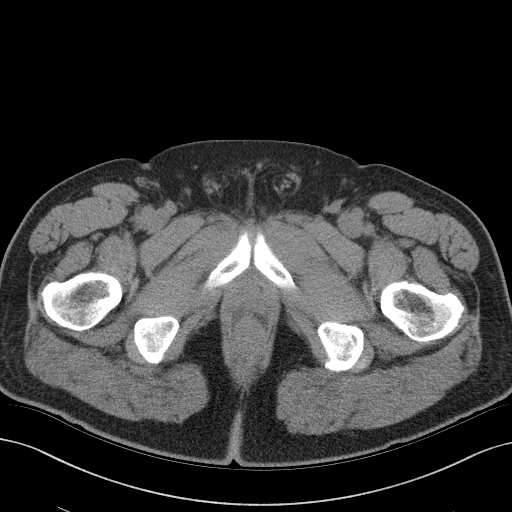
[im 4/90  bone]
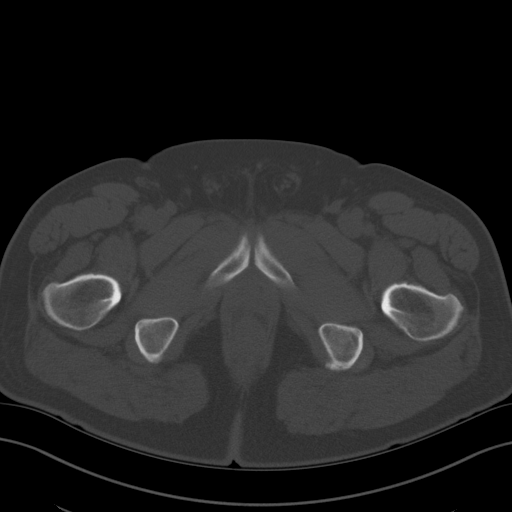
[im 12/90  soft-tissue]
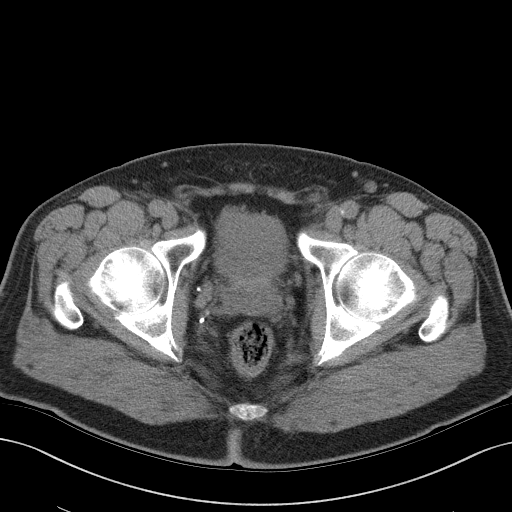
[im 20/90  soft-tissue]
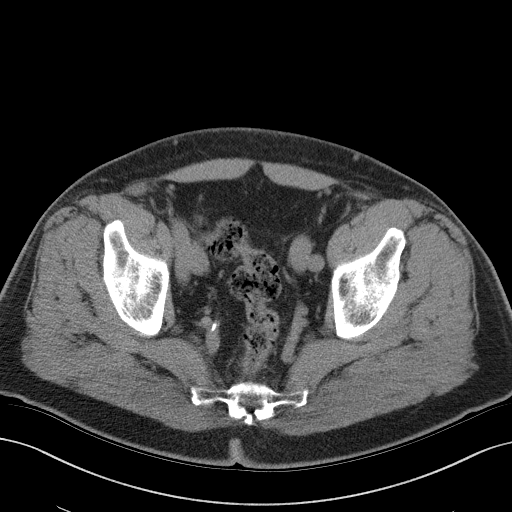
[im 24/90  soft-tissue]
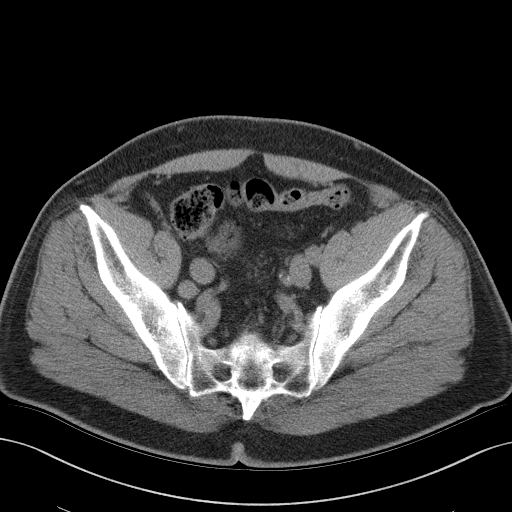
[im 31/90  soft-tissue]
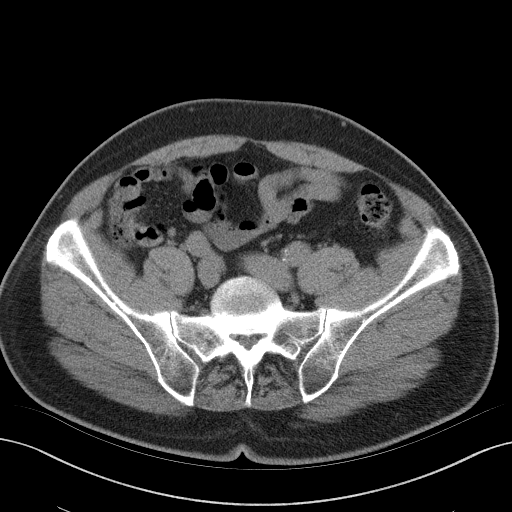
[im 39/90  soft-tissue]
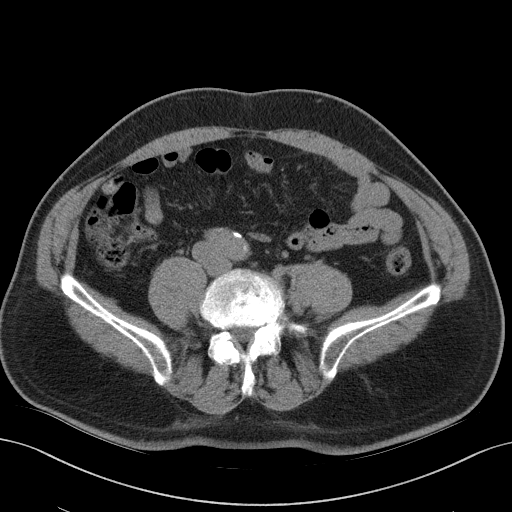
[im 47/90  soft-tissue]
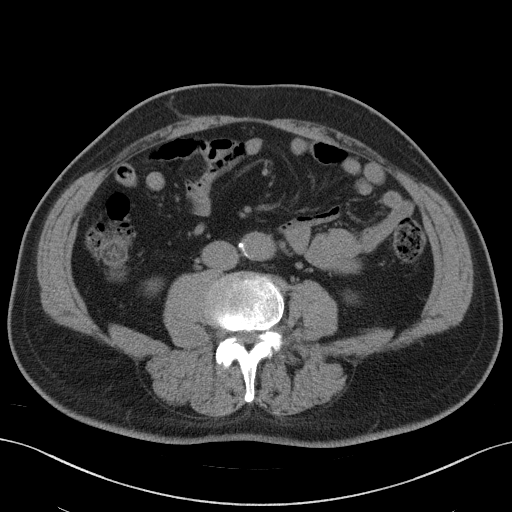
[im 51/90  soft-tissue]
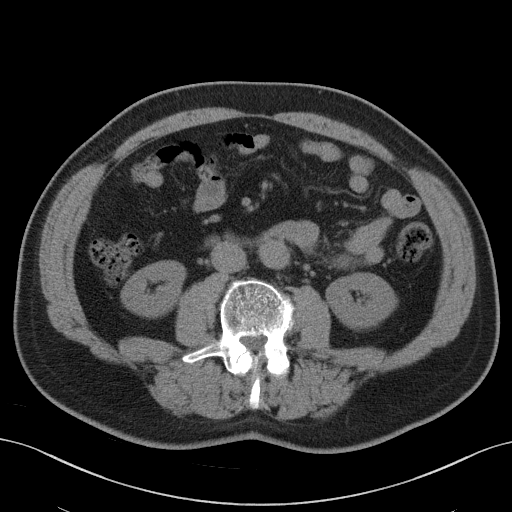
[im 59/90  soft-tissue]
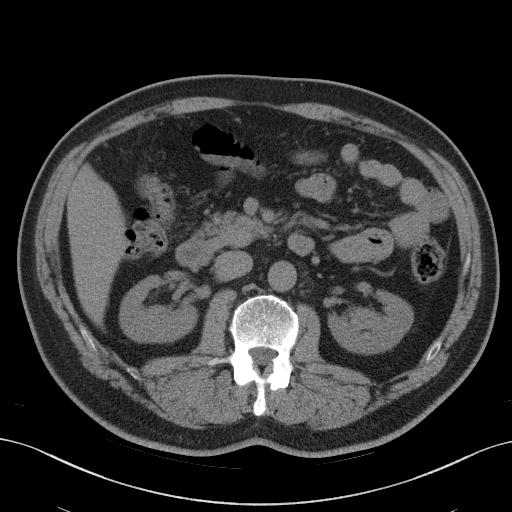
[im 59/90  bone]
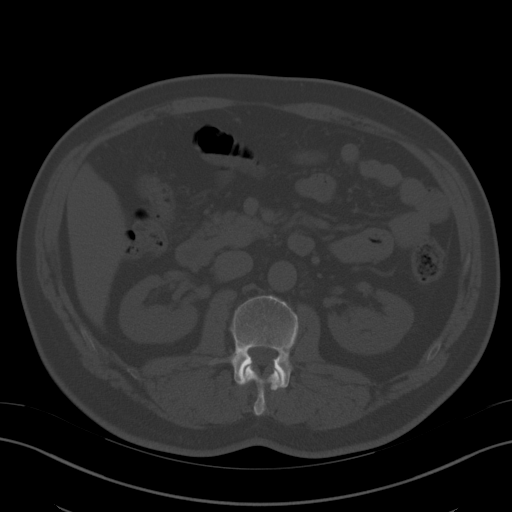
[im 66/90  soft-tissue]
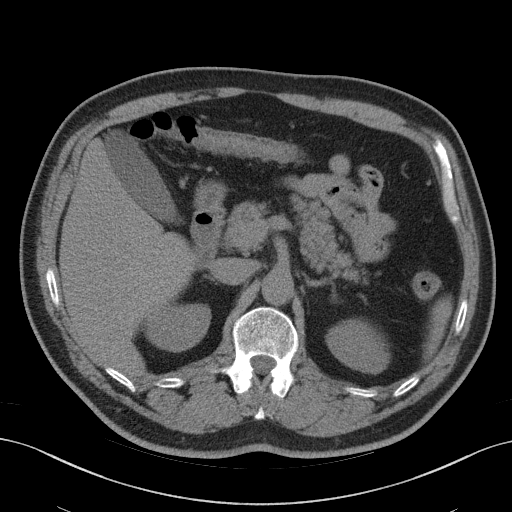
[im 70/90  soft-tissue]
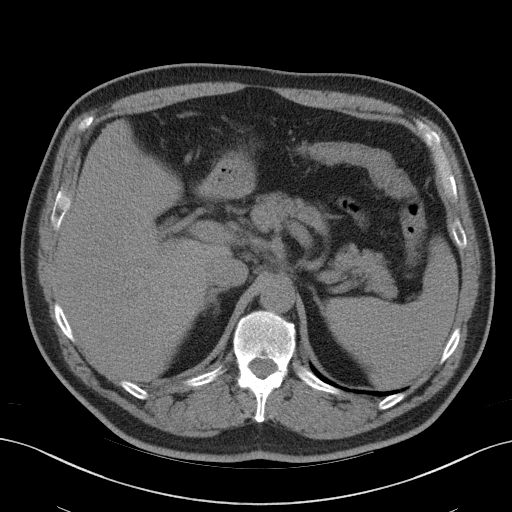
[im 78/90  soft-tissue]
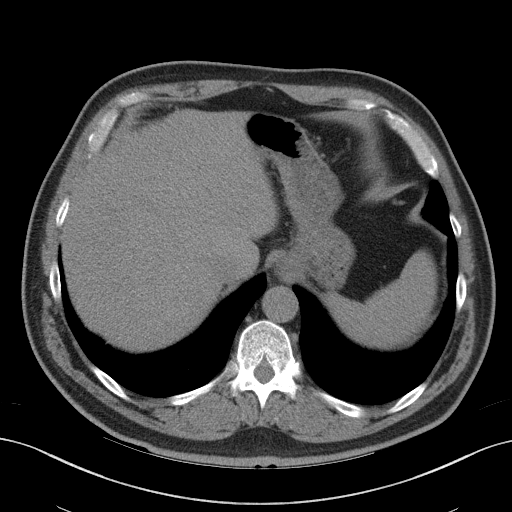
[im 86/90  soft-tissue]
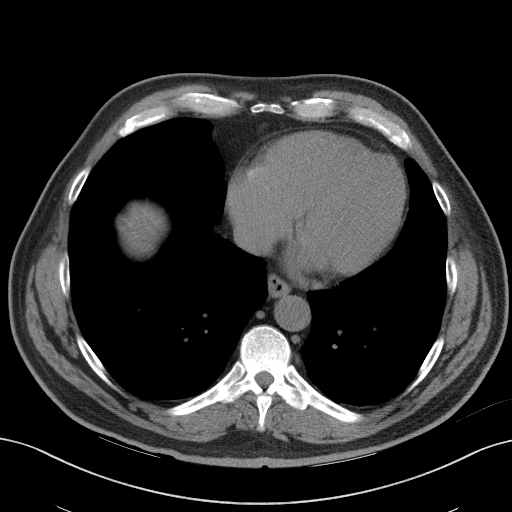

[Series 5: renal stone 3.0 coronal · coronal · 0.80mm/px · 3 of 105 slices shown]
[im 35/105  soft-tissue]
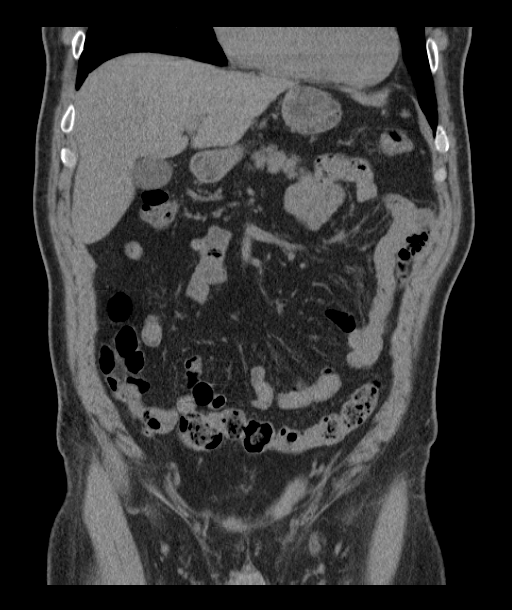
[im 47/105  soft-tissue]
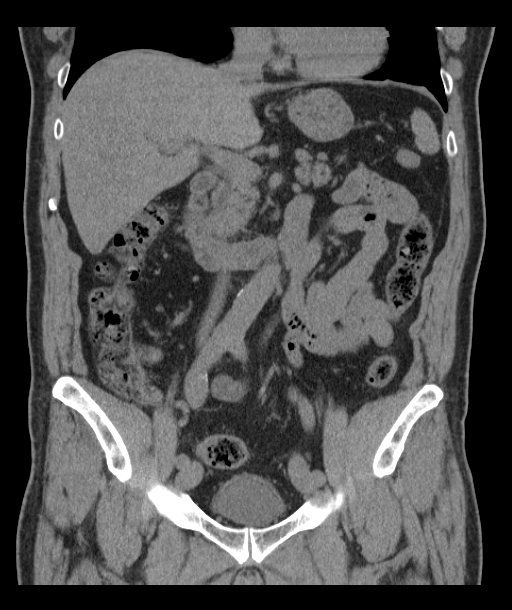
[im 58/105  soft-tissue]
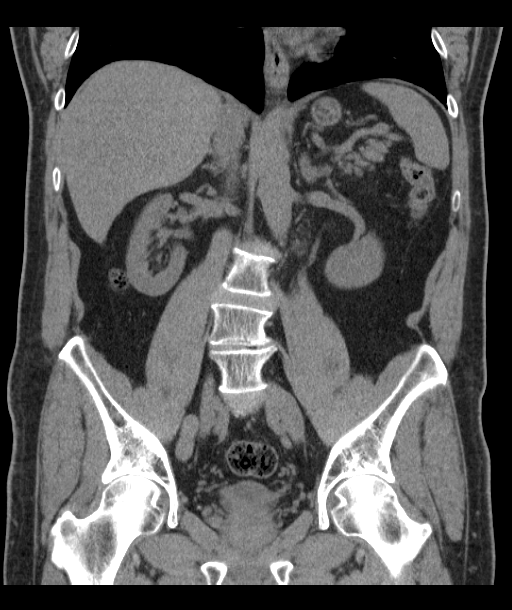

[16 of 46 positions shown; findings below may reference images not displayed]

FINDINGS: Lower chest:  Unremarkable.

Hepatobiliary: No cystic or solid hepatic lesions identified on
today's noncontrast CT examination. The unenhanced appearance of the
gallbladder is normal.

Pancreas: Small calcification in the head of the pancreas
incidentally noted. No definite pancreatic mass or peripancreatic
inflammatory changes on today's noncontrast CT.

Spleen: Unremarkable.

Adrenals/Urinary Tract: There are no abnormal calcifications within
the collecting system of either kidney, along the course of either
ureter, or within the lumen of the urinary bladder. No
hydroureteronephrosis or perinephric stranding to suggest urinary
tract obstruction at this time. The unenhanced appearance of the
kidneys is unremarkable bilaterally. Unenhanced appearance of the
urinary bladder is normal. Bilateral adrenal glands are normal in
appearance.

Stomach/Bowel: Unenhanced appearance of the stomach is normal. No
pathologic dilatation of small bowel or colon. Normal appendix.

Vascular/Lymphatic: Mild atherosclerotic calcifications noted
throughout the abdominal and pelvic vasculature, without evidence of
aneurysm. No lymphadenopathy noted in the abdomen or pelvis on
today's noncontrast CT examination.

Reproductive: Prostate gland and seminal vesicles are unremarkable
in appearance.

Other: No significant volume of ascites.  No pneumoperitoneum.

Musculoskeletal: There are no aggressive appearing lytic or blastic
lesions noted in the visualized portions of the skeleton.
IMPRESSION: 1. No acute findings in the abdomen or pelvis to account for the
patient's symptoms. Specifically, no urinary tract calculi no
findings to suggest urinary tract obstruction at this time.
2. Normal appendix.
3. Mild atherosclerosis.
4. Tiny umbilical hernia containing only omental fat incidentally
noted. No evidence of associated bowel incarceration or obstruction
at this time.

## 2017-09-23 ENCOUNTER — Other Ambulatory Visit: Payer: Self-pay | Admitting: Family Medicine

## 2017-09-24 ENCOUNTER — Other Ambulatory Visit (INDEPENDENT_AMBULATORY_CARE_PROVIDER_SITE_OTHER): Payer: BLUE CROSS/BLUE SHIELD

## 2017-09-24 DIAGNOSIS — E782 Mixed hyperlipidemia: Secondary | ICD-10-CM | POA: Diagnosis not present

## 2017-09-24 DIAGNOSIS — N529 Male erectile dysfunction, unspecified: Secondary | ICD-10-CM | POA: Diagnosis not present

## 2017-09-24 DIAGNOSIS — Z0000 Encounter for general adult medical examination without abnormal findings: Secondary | ICD-10-CM

## 2017-09-24 DIAGNOSIS — E7800 Pure hypercholesterolemia, unspecified: Secondary | ICD-10-CM

## 2017-09-24 DIAGNOSIS — R739 Hyperglycemia, unspecified: Secondary | ICD-10-CM

## 2017-09-24 LAB — COMPREHENSIVE METABOLIC PANEL
ALK PHOS: 59 U/L (ref 39–117)
ALT: 12 U/L (ref 0–53)
AST: 14 U/L (ref 0–37)
Albumin: 4.1 g/dL (ref 3.5–5.2)
BUN: 11 mg/dL (ref 6–23)
CHLORIDE: 108 meq/L (ref 96–112)
CO2: 27 mEq/L (ref 19–32)
CREATININE: 1.05 mg/dL (ref 0.40–1.50)
Calcium: 9.2 mg/dL (ref 8.4–10.5)
GFR: 75.72 mL/min (ref >60.00)
GLUCOSE: 112 mg/dL — AB (ref 70–99)
POTASSIUM: 4.2 meq/L (ref 3.5–5.1)
SODIUM: 142 meq/L (ref 135–145)
TOTAL PROTEIN: 6.8 g/dL (ref 6.0–8.3)
Total Bilirubin: 1.1 mg/dL (ref 0.2–1.2)

## 2017-09-24 LAB — LIPID PANEL
CHOL/HDL RATIO: 5
Cholesterol: 187 mg/dL (ref 0–200)
HDL: 40.7 mg/dL (ref >39.00)
LDL Cholesterol: 114 mg/dL — ABNORMAL HIGH (ref 0–99)
NONHDL: 146.7
Triglycerides: 163 mg/dL — ABNORMAL HIGH (ref 0.0–149.0)
VLDL: 32.6 mg/dL (ref 0.0–40.0)

## 2017-09-24 LAB — CBC
HEMATOCRIT: 42.9 % (ref 39.0–52.0)
Hemoglobin: 14.8 g/dL (ref 13.0–17.0)
MCHC: 34.4 g/dL (ref 30.0–36.0)
MCV: 93.8 fl (ref 78.0–100.0)
Platelets: 168 10*3/uL (ref 150.0–400.0)
RBC: 4.58 Mil/uL (ref 4.22–5.81)
RDW: 13 % (ref 11.5–15.5)
WBC: 4.5 10*3/uL (ref 4.0–10.5)

## 2017-09-24 LAB — TSH: TSH: 3.79 u[IU]/mL (ref 0.35–4.50)

## 2017-09-24 LAB — HEMOGLOBIN A1C: Hgb A1c MFr Bld: 5.1 % (ref 4.6–6.5)

## 2017-10-01 ENCOUNTER — Encounter: Payer: BLUE CROSS/BLUE SHIELD | Admitting: Family Medicine

## 2017-10-04 ENCOUNTER — Ambulatory Visit (INDEPENDENT_AMBULATORY_CARE_PROVIDER_SITE_OTHER): Payer: BLUE CROSS/BLUE SHIELD | Admitting: Family Medicine

## 2017-10-04 ENCOUNTER — Encounter: Payer: Self-pay | Admitting: Family Medicine

## 2017-10-04 VITALS — BP 117/70 | HR 79 | Temp 98.5°F | Resp 18 | Ht 75.0 in | Wt 244.6 lb

## 2017-10-04 DIAGNOSIS — Z23 Encounter for immunization: Secondary | ICD-10-CM

## 2017-10-04 DIAGNOSIS — Z0000 Encounter for general adult medical examination without abnormal findings: Secondary | ICD-10-CM

## 2017-10-04 DIAGNOSIS — E039 Hypothyroidism, unspecified: Secondary | ICD-10-CM

## 2017-10-04 DIAGNOSIS — R739 Hyperglycemia, unspecified: Secondary | ICD-10-CM | POA: Diagnosis not present

## 2017-10-04 DIAGNOSIS — E7800 Pure hypercholesterolemia, unspecified: Secondary | ICD-10-CM

## 2017-10-04 DIAGNOSIS — F418 Other specified anxiety disorders: Secondary | ICD-10-CM

## 2017-10-04 DIAGNOSIS — Z79899 Other long term (current) drug therapy: Secondary | ICD-10-CM

## 2017-10-04 DIAGNOSIS — E669 Obesity, unspecified: Secondary | ICD-10-CM

## 2017-10-04 MED ORDER — CELECOXIB 200 MG PO CAPS: ORAL_CAPSULE | ORAL | 1 refills | Status: DC

## 2017-10-04 MED ORDER — EFINACONAZOLE 10 % EX SOLN: 1.0000 "application " | Freq: Every day | CUTANEOUS | 1 refills | Status: DC

## 2017-10-04 MED ORDER — ALPRAZOLAM 0.25 MG PO TABS: 0.2500 mg | ORAL_TABLET | Freq: Two times a day (BID) | ORAL | 2 refills | Status: DC | PRN

## 2017-10-04 MED ORDER — ESCITALOPRAM OXALATE 5 MG PO TABS: 5.0000 mg | ORAL_TABLET | Freq: Every day | ORAL | Status: DC

## 2017-10-04 MED ORDER — LEVOTHYROXINE SODIUM 25 MCG PO TABS: 25.0000 ug | ORAL_TABLET | Freq: Every day | ORAL | 1 refills | Status: DC

## 2017-10-04 MED ORDER — ASPIRIN EC 81 MG PO TBEC: 81.0000 mg | DELAYED_RELEASE_TABLET | Freq: Every day | ORAL | Status: AC

## 2017-10-04 NOTE — Assessment & Plan Note (Signed)
Patient encouraged to maintain heart healthy diet, regular exercise, adequate sleep. Consider daily probiotics. Take medications as prescribed 

## 2017-10-04 NOTE — Progress Notes (Signed)
Subjective:  I acted as a Education administrator for Dr. Charlett Blake. [place], [place]  Patient ID: Gerald Booth, male    DOB: May 05, 1956, 61 y.o.   MRN: [id number]  No chief complaint on file.   HPI  Patient is in today for an annual exam and follow up on chronic medical concerns including obesity, hyperlipidemia, hyperglycemia, depression and anxiety. He is considering retiring after being laid off. He has been working at weight loss by decreasing his portions and decreasing his meat intake. Notes that he is not taking Lexapro and has been feeling the Alprazolam is not working as well. No suicidal ideation but some anhedonia and anxiety is noted. Counseling has been helpful. Denies CP/palp/SOB/HA/congestion/fevers/GI or GU c/o. Taking meds as prescribed   Patient Care Team: Mosie Lukes, MD as PCP - General (Family Medicine) Crista Luria, MD as Consulting Physician (Dermatology)   Past Medical History:  Diagnosis Date  . Anxiety and depression 01/27/2013  . Chicken pox as a child  . Depression with anxiety 01/27/2013  . ED (erectile dysfunction) 10/10/2012  . Elevated BP 10/03/2012  . Elevated BP 10/03/2012  . Hearing loss 10/10/2012  . 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 Hypothyroid 03/31/2017  . Kidney stones 10/10/2012  . Measles as a child  . Mumps as a child  . Obese 10/03/2012  . Onychomycosis 10/10/2012  . Preventative health care 10/10/2012  . Skin cancer of face 04/06/2016    Past Surgical History:  Procedure Laterality Date  . WISDOM TOOTH EXTRACTION  2011/03/21   1 tooth    Family History  Problem Relation Age of Onset  . Cancer Mother        breast, bone  . Hyperlipidemia Father   . Other Father        prostate surgery  . Dementia Father   . Heart attack Maternal Grandmother   . Kidney disease Maternal Grandfather        kidney failure  . AAA (abdominal aortic aneurysm) Brother     Social History   Social History  . Marital status: Married    Spouse name: N/A  . Number of  children: N/A  . Years of education: N/A   Occupational History  . Not on file.   Social History Main Topics  . Smoking status: Former Smoker    Types: Cigarettes    Start date: 12/11/1991  . Smokeless tobacco: Never Used     Comment: smoked on weekends  . Alcohol use Yes     Comment: 1 a day  . Drug use: No  . Sexual activity: Yes     Comment: no dietary changes, lives with wife   Other Topics Concern  . Not on file   Social History Narrative   Makes plates for a living.   Lives at home with wife         Brother passed away 2016/03/20 from Aorta aneurism     Outpatient Medications Prior to Visit  Medication Sig Dispense Refill  . acyclovir (ZOVIRAX) 800 MG tablet Take 1 tablet (800 mg total) by mouth 3 (three) times daily as needed. 180 tablet 0  . ciclopirox (CICLODAN) 8 % solution Apply topically at bedtime. Apply over nail and surrounding skin. Apply daily over previous coat. After seven (7) days, may remove with alcohol and continue cycle. 6.6 mL 1  . KRILL OIL OMEGA-3 PO Take by mouth.    . neomycin-polymyxin-hydrocortisone (CORTISPORIN) otic solution Place 3 drops into both ears 2 (two) times  daily. 10 mL 1  . ranitidine (ZANTAC) 300 MG tablet TAKE 1 TABLET (300 MG TOTAL) BY MOUTH AS NEEDED 90 tablet 3  . tadalafil (CIALIS) 5 MG tablet Take 1 tablet (5 mg total) by mouth daily as needed for erectile dysfunction. 40 tablet 5  . ALPRAZolam (XANAX) 0.25 MG tablet TAKE 1 TABLET BY MOUTH EVERY DAY AS NEEDED FOR ANXIETY OR SLEEP 30 tablet 1  . aspirin EC 81 MG tablet Take 1 tablet (81 mg total) by mouth daily.    . celecoxib (CELEBREX) 200 MG capsule TAKE ONE CAPSULE BY MOUTH TWICE A DAY AT BEDTIME 180 capsule 0  . Efinaconazole (JUBLIA) 10 % SOLN Apply 1 application topically daily. 16 mL 1  . escitalopram (LEXAPRO) 5 MG tablet TAKE 1 TABLET BY MOUTH EVERY DAY 90 tablet 0  . levothyroxine (SYNTHROID, LEVOTHROID) 25 MCG tablet Take 1 tablet (25 mcg total) by mouth daily. 90 tablet  1  . levothyroxine (SYNTHROID, LEVOTHROID) 25 MCG tablet TAKE 1 TABLET (25 MCG TOTAL) BY MOUTH DAILY. 90 tablet 0   No facility-administered medications prior to visit.     Allergies  Allergen Reactions  . Penicillins     Nose bleeds    Review of Systems  Constitutional: Negative for chills, fever and malaise/fatigue.  HENT: Negative for congestion and hearing loss.   Eyes: Negative for discharge.  Respiratory: Negative for cough, sputum production and shortness of breath.   Cardiovascular: Negative for chest pain, palpitations and leg swelling.  Gastrointestinal: Negative for abdominal pain, blood in stool, constipation, diarrhea, heartburn, nausea and vomiting.  Genitourinary: Negative for dysuria, frequency, hematuria and urgency.  Musculoskeletal: Negative for back pain, falls and myalgias.  Skin: Negative for rash.  Neurological: Negative for dizziness, sensory change, loss of consciousness, weakness and headaches.  Endo/Heme/Allergies: Negative for environmental allergies. Does not bruise/bleed easily.  Psychiatric/Behavioral: Positive for depression. Negative for suicidal ideas. The patient is nervous/anxious. The patient does not have insomnia.        Objective:    Physical Exam  Constitutional: He is oriented to person, place, and time. He appears well-developed and well-nourished. No distress.  HENT:  Head: Normocephalic and atraumatic.  Eyes: Conjunctivae are normal.  Neck: Neck supple. No thyromegaly present.  Cardiovascular: Normal rate, regular rhythm and normal heart sounds.   No murmur heard. Pulmonary/Chest: Effort normal and breath sounds normal. No respiratory distress. He has no wheezes.  Abdominal: Soft. Bowel sounds are normal. He exhibits no mass. There is no tenderness.  Musculoskeletal: He exhibits no edema.  Lymphadenopathy:    He has no cervical adenopathy.  Neurological: He is alert and oriented to person, place, and time.  Skin: Skin is warm  and dry.  Psychiatric: He has a normal mood and affect. His behavior is normal.    BP 117/70 (BP Location: Left Arm, Patient Position: Sitting, Cuff Size: Normal)   Pulse 79   Temp 98.5 F (36.9 C) (Oral)   Resp 18   Ht 6\' 3"  (1.905 m)   Wt 244 lb 9.6 oz (110.9 kg)   SpO2 96%   BMI 30.57 kg/m  Wt Readings from Last 3 Encounters:  10/04/17 244 lb 9.6 oz (110.9 kg)  10/08/16 247 lb 6 oz (112.2 kg)  04/06/16 241 lb 4 oz (109.4 kg)   BP Readings from Last 3 Encounters:  10/04/17 117/70  10/08/16 108/64  04/06/16 124/72     Immunization History  Administered Date(s) Administered  . Influenza Whole 09/09/2009, 10/02/2012  .  Influenza,inj,Quad PF,6+ Mos 09/25/2013, 10/05/2014, 09/01/2015, 08/28/2016, 10/04/2017  . Pneumococcal Conjugate-13 12/04/2013  . Tdap 10/03/2012  . Zoster 06/18/2014    Health Maintenance  Topic Date Due  . Fecal DNA (Cologuard)  05/18/2018  . TETANUS/TDAP  10/03/2022  . INFLUENZA VACCINE  Completed  . Hepatitis C Screening  Completed  . HIV Screening  Completed    Lab Results  Component Value Date   WBC 4.5 09/24/2017   HGB 14.8 09/24/2017   HCT 42.9 09/24/2017   PLT 168.0 09/24/2017   GLUCOSE 112 (H) 09/24/2017   CHOL 187 09/24/2017   TRIG 163.0 (H) 09/24/2017   HDL 40.70 09/24/2017   LDLDIRECT 152.0 03/28/2017   LDLCALC 114 (H) 09/24/2017   ALT 12 09/24/2017   AST 14 09/24/2017   NA 142 09/24/2017   K 4.2 09/24/2017   CL 108 09/24/2017   CREATININE 1.05 09/24/2017   BUN 11 09/24/2017   CO2 27 09/24/2017   TSH 3.79 09/24/2017   PSA 0.79 02/29/2016   HGBA1C 5.1 09/24/2017    Lab Results  Component Value Date   TSH 3.79 09/24/2017   Lab Results  Component Value Date   WBC 4.5 09/24/2017   HGB 14.8 09/24/2017   HCT 42.9 09/24/2017   MCV 93.8 09/24/2017   PLT 168.0 09/24/2017   Lab Results  Component Value Date   NA 142 09/24/2017   K 4.2 09/24/2017   CO2 27 09/24/2017   GLUCOSE 112 (H) 09/24/2017   BUN 11 09/24/2017     CREATININE 1.05 09/24/2017   BILITOT 1.1 09/24/2017   ALKPHOS 59 09/24/2017   AST 14 09/24/2017   ALT 12 09/24/2017   PROT 6.8 09/24/2017   ALBUMIN 4.1 09/24/2017   CALCIUM 9.2 09/24/2017   GFR 75.72 09/24/2017   Lab Results  Component Value Date   CHOL 187 09/24/2017   Lab Results  Component Value Date   HDL 40.70 09/24/2017   Lab Results  Component Value Date   LDLCALC 114 (H) 09/24/2017   Lab Results  Component Value Date   TRIG 163.0 (H) 09/24/2017   Lab Results  Component Value Date   CHOLHDL 5 09/24/2017   Lab Results  Component Value Date   HGBA1C 5.1 09/24/2017         Assessment & Plan:   Problem List Items Addressed This Visit    HYPERCHOLESTEROLEMIA    Encouraged heart healthy diet, increase exercise, avoid trans fats, consider a krill oil cap daily      Relevant Medications   aspirin EC 81 MG tablet   Obese    Encouraged DASH diet, decrease po intake and increase exercise as tolerated. Needs 7-8 hours of sleep nightly. Avoid trans fats, eat small, frequent meals every 4-5 hours with lean proteins, complex carbs and healthy fats. Minimize simple carbs, bariatric referral placed      Preventative health care    Patient encouraged to maintain heart healthy diet, regular exercise, adequate sleep. Consider daily probiotics. Take medications as prescribed      Relevant Medications   aspirin EC 81 MG tablet   Depression with anxiety    Has not been taking his Lexapro and has been needing more Alprazolam, agrees to start the Lexapro again.       Relevant Medications   escitalopram (LEXAPRO) 5 MG tablet   ALPRAZolam (XANAX) 0.25 MG tablet   Hyperglycemia    hgba1c acceptable, minimize simple carbs. Increase exercise as tolerated.  Hypothyroid    On Levothyroxine, continue to monitor      Relevant Medications   levothyroxine (SYNTHROID, LEVOTHROID) 25 MCG tablet    Other Visit Diagnoses    Needs flu shot    -  Primary   Relevant  Orders   Flu Vaccine QUAD 6+ mos PF IM (Fluarix Quad PF) (Completed)   High risk medication use       Relevant Orders   Pain Mgmt, Profile 8 w/Conf, U      I have discontinued Mr. Saulters ALPRAZolam. I have also changed his escitalopram. Additionally, I am having him start on ALPRAZolam. Lastly, I am having him maintain his KRILL OIL OMEGA-3 PO, ciclopirox, tadalafil, neomycin-polymyxin-hydrocortisone, acyclovir, ranitidine, levothyroxine, aspirin EC, celecoxib, and Efinaconazole.  Meds ordered this encounter  Medications  . levothyroxine (SYNTHROID, LEVOTHROID) 25 MCG tablet    Sig: Take 1 tablet (25 mcg total) by mouth daily.    Dispense:  90 tablet    Refill:  1  . escitalopram (LEXAPRO) 5 MG tablet    Sig: Take 1 tablet (5 mg total) by mouth daily.    Dispense:  90 tablet    Refill:  01  . aspirin EC 81 MG tablet    Sig: Take 1 tablet (81 mg total) by mouth daily.  . celecoxib (CELEBREX) 200 MG capsule    Sig: TAKE ONE CAPSULE BY MOUTH TWICE A DAY AT BEDTIME    Dispense:  180 capsule    Refill:  1  . Efinaconazole (JUBLIA) 10 % SOLN    Sig: Apply 1 application topically daily.    Dispense:  16 mL    Refill:  1  . ALPRAZolam (XANAX) 0.25 MG tablet    Sig: Take 1 tablet (0.25 mg total) by mouth 2 (two) times daily as needed for anxiety.    Dispense:  45 tablet    Refill:  2    CMA served as scribe during this visit. History, Physical and Plan performed by medical provider. Documentation and orders reviewed and attested to.  Penni Homans, MD

## 2017-10-04 NOTE — Patient Instructions (Signed)
Preventive Care 40-64 Years, Male Preventive care refers to lifestyle choices and visits with your health care provider that can promote health and wellness. What does preventive care include?  A yearly physical exam. This is also called an annual well check.  Dental exams once or twice a year.  Routine eye exams. Ask your health care provider how often you should have your eyes checked.  Personal lifestyle choices, including: ? Daily care of your teeth and gums. ? Regular physical activity. ? Eating a healthy diet. ? Avoiding tobacco and drug use. ? Limiting alcohol use. ? Practicing safe sex. ? Taking low-dose aspirin every day starting at age 63. What happens during an annual well check? The services and screenings done by your health care provider during your annual well check will depend on your age, overall health, lifestyle risk factors, and family history of disease. Counseling Your health care provider may ask you questions about your:  Alcohol use.  Tobacco use.  Drug use.  Emotional well-being.  Home and relationship well-being.  Sexual activity.  Eating habits.  Work and work Statistician.  Screening You may have the following tests or measurements:  Height, weight, and BMI.  Blood pressure.  Lipid and cholesterol levels. These may be checked every 5 years, or more frequently if you are over 63 years old.  Skin check.  Lung cancer screening. You may have this screening every year starting at age 63 if you have a 30-pack-year history of smoking and currently smoke or have quit within the past 15 years.  Fecal occult blood test (FOBT) of the stool. You may have this test every year starting at age 63.  Flexible sigmoidoscopy or colonoscopy. You may have a sigmoidoscopy every 5 years or a colonoscopy every 10 years starting at age 63.  Prostate cancer screening. Recommendations will vary depending on your family history and other risks.  Hepatitis C  blood test.  Hepatitis B blood test.  Sexually transmitted disease (STD) testing.  Diabetes screening. This is done by checking your blood sugar (glucose) after you have not eaten for a while (fasting). You may have this done every 1-3 years.  Discuss your test results, treatment options, and if necessary, the need for more tests with your health care provider. Vaccines Your health care provider may recommend certain vaccines, such as:  Influenza vaccine. This is recommended every year.  Tetanus, diphtheria, and acellular pertussis (Tdap, Td) vaccine. You may need a Td booster every 10 years.  Varicella vaccine. You may need this if you have not been vaccinated.  Zoster vaccine. You may need this after age 63.  Measles, mumps, and rubella (MMR) vaccine. You may need at least one dose of MMR if you were born in 1957 or later. You may also need a second dose.  Pneumococcal 13-valent conjugate (PCV13) vaccine. You may need this if you have certain conditions and have not been vaccinated.  Pneumococcal polysaccharide (PPSV23) vaccine. You may need one or two doses if you smoke cigarettes or if you have certain conditions.  Meningococcal vaccine. You may need this if you have certain conditions.  Hepatitis A vaccine. You may need this if you have certain conditions or if you travel or work in places where you may be exposed to hepatitis A.  Hepatitis B vaccine. You may need this if you have certain conditions or if you travel or work in places where you may be exposed to hepatitis B.  Haemophilus influenzae type b (Hib) vaccine.  You may need this if you have certain risk factors.  Talk to your health care provider about which screenings and vaccines you need and how often you need them. This information is not intended to replace advice given to you by your health care provider. Make sure you discuss any questions you have with your health care provider. Document Released: 12/23/2015  Document Revised: 08/15/2016 Document Reviewed: 09/27/2015 Elsevier Interactive Patient Education  2017 [employer]  

## 2017-10-04 NOTE — Assessment & Plan Note (Signed)
Encouraged heart healthy diet, increase exercise, avoid trans fats, consider a krill oil cap daily 

## 2017-10-04 NOTE — Assessment & Plan Note (Signed)
hgba1c acceptable, minimize simple carbs. Increase exercise as tolerated.  

## 2017-10-06 NOTE — Assessment & Plan Note (Signed)
Has not been taking his Lexapro and has been needing more Alprazolam, agrees to start the Lexapro again.

## 2017-10-06 NOTE — Assessment & Plan Note (Signed)
Encouraged DASH diet, decrease po intake and increase exercise as tolerated. Needs 7-8 hours of sleep nightly. Avoid trans fats, eat small, frequent meals every 4-5 hours with lean proteins, complex carbs and healthy fats. Minimize simple carbs, bariatric referral placed

## 2017-10-06 NOTE — Assessment & Plan Note (Signed)
On Levothyroxine, continue to monitor 

## 2018-01-10 ENCOUNTER — Ambulatory Visit: Payer: BLUE CROSS/BLUE SHIELD | Admitting: Family Medicine

## 2018-03-31 ENCOUNTER — Encounter: Payer: Self-pay | Admitting: Internal Medicine

## 2018-04-09 ENCOUNTER — Encounter: Payer: Self-pay | Admitting: Internal Medicine

## 2018-04-13 ENCOUNTER — Other Ambulatory Visit: Payer: Self-pay | Admitting: Family Medicine

## 2018-05-11 ENCOUNTER — Other Ambulatory Visit: Payer: Self-pay | Admitting: Family Medicine

## 2018-05-13 ENCOUNTER — Other Ambulatory Visit: Payer: Self-pay | Admitting: Family Medicine

## 2018-05-13 ENCOUNTER — Encounter: Payer: Self-pay | Admitting: Family Medicine

## 2018-05-13 DIAGNOSIS — Z79899 Other long term (current) drug therapy: Secondary | ICD-10-CM

## 2018-05-13 MED ORDER — ALPRAZOLAM 0.25 MG PO TABS: 0.2500 mg | ORAL_TABLET | Freq: Two times a day (BID) | ORAL | 2 refills | Status: DC | PRN

## 2018-05-27 ENCOUNTER — Ambulatory Visit (INDEPENDENT_AMBULATORY_CARE_PROVIDER_SITE_OTHER): Payer: BLUE CROSS/BLUE SHIELD | Admitting: Family Medicine

## 2018-05-27 VITALS — BP 126/80 | HR 71 | Temp 97.1°F | Resp 18 | Wt 249.8 lb

## 2018-05-27 DIAGNOSIS — E039 Hypothyroidism, unspecified: Secondary | ICD-10-CM | POA: Diagnosis not present

## 2018-05-27 DIAGNOSIS — Z79899 Other long term (current) drug therapy: Secondary | ICD-10-CM

## 2018-05-27 DIAGNOSIS — E7800 Pure hypercholesterolemia, unspecified: Secondary | ICD-10-CM

## 2018-05-27 DIAGNOSIS — K219 Gastro-esophageal reflux disease without esophagitis: Secondary | ICD-10-CM | POA: Diagnosis not present

## 2018-05-27 DIAGNOSIS — F418 Other specified anxiety disorders: Secondary | ICD-10-CM | POA: Diagnosis not present

## 2018-05-27 DIAGNOSIS — R739 Hyperglycemia, unspecified: Secondary | ICD-10-CM

## 2018-05-27 MED ORDER — RANITIDINE HCL 300 MG PO TABS: ORAL_TABLET | ORAL | 2 refills | Status: DC

## 2018-05-27 MED ORDER — ALPRAZOLAM 0.25 MG PO TABS: 0.2500 mg | ORAL_TABLET | Freq: Two times a day (BID) | ORAL | 2 refills | Status: DC | PRN

## 2018-05-27 MED ORDER — CELECOXIB 200 MG PO CAPS: ORAL_CAPSULE | ORAL | 1 refills | Status: DC

## 2018-05-27 MED ORDER — ESCITALOPRAM OXALATE 5 MG PO TABS: 5.0000 mg | ORAL_TABLET | Freq: Every day | ORAL | 1 refills | Status: DC

## 2018-05-27 MED ORDER — ACYCLOVIR 800 MG PO TABS: 800.0000 mg | ORAL_TABLET | Freq: Three times a day (TID) | ORAL | 0 refills | Status: DC | PRN

## 2018-05-27 MED ORDER — LEVOTHYROXINE SODIUM 25 MCG PO TABS: 25.0000 ug | ORAL_TABLET | Freq: Every day | ORAL | 1 refills | Status: DC

## 2018-05-27 MED ORDER — TADALAFIL 5 MG PO TABS: 5.0000 mg | ORAL_TABLET | Freq: Every day | ORAL | 5 refills | Status: DC | PRN

## 2018-05-27 NOTE — Patient Instructions (Addendum)
Increase your Lexapro/Escitalopram to 10 mg (2 tabs) daily   Insomnia Insomnia is a sleep disorder that makes it difficult to fall asleep or to stay asleep. Insomnia can cause tiredness (fatigue), low energy, difficulty concentrating, mood swings, and poor performance at work or school. There are three different ways to classify insomnia:  Difficulty falling asleep.  Difficulty staying asleep.  Waking up too early in the morning.  Any type of insomnia can be long-term (chronic) or short-term (acute). Both are common. Short-term insomnia usually lasts for three months or less. Chronic insomnia occurs at least three times a week for longer than three months. What are the causes? Insomnia may be caused by another condition, situation, or substance, such as:  Anxiety.  Certain medicines.  Gastroesophageal reflux disease (GERD) or other gastrointestinal conditions.  Asthma or other breathing conditions.  Restless legs syndrome, sleep apnea, or other sleep disorders.  Chronic pain.  Menopause. This may include hot flashes.  Stroke.  Abuse of alcohol, tobacco, or illegal drugs.  Depression.  Caffeine.  Neurological disorders, such as Alzheimer disease.  An overactive thyroid (hyperthyroidism).  The cause of insomnia may not be known. What increases the risk? Risk factors for insomnia include:  Gender. Women are more commonly affected than men.  Age. Insomnia is more common as you get older.  Stress. This may involve your professional or personal life.  Income. Insomnia is more common in people with lower income.  Lack of exercise.  Irregular work schedule or night shifts.  Traveling between different time zones.  What are the signs or symptoms? If you have insomnia, trouble falling asleep or trouble staying asleep is the main symptom. This may lead to other symptoms, such as:  Feeling fatigued.  Feeling nervous about going to sleep.  Not feeling rested in  the morning.  Having trouble concentrating.  Feeling irritable, anxious, or depressed.  How is this treated? Treatment for insomnia depends on the cause. If your insomnia is caused by an underlying condition, treatment will focus on addressing the condition. Treatment may also include:  Medicines to help you sleep.  Counseling or therapy.  Lifestyle adjustments.  Follow these instructions at home:  Take medicines only as directed by your health care provider.  Keep regular sleeping and waking hours. Avoid naps.  Keep a sleep diary to help you and your health care provider figure out what could be causing your insomnia. Include: ? When you sleep. ? When you wake up during the night. ? How well you sleep. ? How rested you feel the next day. ? Any side effects of medicines you are taking. ? What you eat and drink.  Make your bedroom a comfortable place where it is easy to fall asleep: ? Put up shades or special blackout curtains to block light from outside. ? Use a white noise machine to block noise. ? Keep the temperature cool.  Exercise regularly as directed by your health care provider. Avoid exercising right before bedtime.  Use relaxation techniques to manage stress. Ask your health care provider to suggest some techniques that may work well for you. These may include: ? Breathing exercises. ? Routines to release muscle tension. ? Visualizing peaceful scenes.  Cut back on alcohol, caffeinated beverages, and cigarettes, especially close to bedtime. These can disrupt your sleep.  Do not overeat or eat spicy foods right before bedtime. This can lead to digestive discomfort that can make it hard for you to sleep.  Limit screen use before bedtime.  This includes: ? Watching TV. ? Using your smartphone, tablet, and computer.  Stick to a routine. This can help you fall asleep faster. Try to do a quiet activity, brush your teeth, and go to bed at the same time each  night.  Get out of bed if you are still awake after 15 minutes of trying to sleep. Keep the lights down, but try reading or doing a quiet activity. When you feel sleepy, go back to bed.  Make sure that you drive carefully. Avoid driving if you feel very sleepy.  Keep all follow-up appointments as directed by your health care provider. This is important. Contact a health care provider if:  You are tired throughout the day or have trouble in your daily routine due to sleepiness.  You continue to have sleep problems or your sleep problems get worse. Get help right away if:  You have serious thoughts about hurting yourself or someone else. This information is not intended to replace advice given to you by your health care provider. Make sure you discuss any questions you have with your health care provider. Document Released: 11/23/2000 Document Revised: 04/27/2016 Document Reviewed: 08/27/2014 Elsevier Interactive Patient Education  [employer].

## 2018-05-27 NOTE — Assessment & Plan Note (Signed)
Using Escitalopram 5 mg daily and he feels it helps well even under stress he notes he has been under a great deal of stress. His alprazolam was not helping his sleep. He may try taking 2 tabs prn to see if that helps more.

## 2018-05-27 NOTE — Assessment & Plan Note (Signed)
Encouraged heart healthy diet, increase exercise, avoid trans fats, consider a krill oil cap daily 

## 2018-05-27 NOTE — Assessment & Plan Note (Signed)
On Levothyroxine, continue to monitor 

## 2018-05-27 NOTE — Assessment & Plan Note (Signed)
hgba1c acceptable, minimize simple carbs. Increase exercise as tolerated.  

## 2018-05-28 LAB — CBC
HCT: 44.8 % (ref 39.0–52.0)
HEMOGLOBIN: 15.9 g/dL (ref 13.0–17.0)
MCHC: 35.5 g/dL (ref 30.0–36.0)
MCV: 92.2 fl (ref 78.0–100.0)
PLATELETS: 179 10*3/uL (ref 150.0–400.0)
RBC: 4.86 Mil/uL (ref 4.22–5.81)
RDW: 13.1 % (ref 11.5–15.5)
WBC: 5.6 10*3/uL (ref 4.0–10.5)

## 2018-05-28 LAB — PAIN MGMT, PROFILE 8 W/CONF, U
6 ACETYLMORPHINE: NEGATIVE ng/mL (ref <10)
ALCOHOL METABOLITES: NEGATIVE ng/mL (ref <500)
Amphetamines: NEGATIVE ng/mL (ref <500)
BENZODIAZEPINES: NEGATIVE ng/mL (ref <100)
Buprenorphine, Urine: NEGATIVE ng/mL (ref <5)
COCAINE METABOLITE: NEGATIVE ng/mL (ref <150)
CREATININE: 61.7 mg/dL
MDMA: NEGATIVE ng/mL (ref <500)
Marijuana Metabolite: NEGATIVE ng/mL (ref <20)
OPIATES: NEGATIVE ng/mL (ref <100)
Oxidant: NEGATIVE ug/mL (ref <200)
Oxycodone: NEGATIVE ng/mL (ref <100)
PH: 6.9 (ref 4.5–9.0)

## 2018-05-28 LAB — LIPID PANEL
CHOLESTEROL: 244 mg/dL — AB (ref 0–200)
HDL: 49 mg/dL (ref >39.00)
NONHDL: 194.84
TRIGLYCERIDES: 242 mg/dL — AB (ref 0.0–149.0)
Total CHOL/HDL Ratio: 5
VLDL: 48.4 mg/dL — ABNORMAL HIGH (ref 0.0–40.0)

## 2018-05-28 LAB — COMPREHENSIVE METABOLIC PANEL
ALBUMIN: 4.5 g/dL (ref 3.5–5.2)
ALK PHOS: 70 U/L (ref 39–117)
ALT: 17 U/L (ref 0–53)
AST: 17 U/L (ref 0–37)
BILIRUBIN TOTAL: 0.9 mg/dL (ref 0.2–1.2)
BUN: 12 mg/dL (ref 6–23)
CO2: 28 meq/L (ref 19–32)
CREATININE: 1.04 mg/dL (ref 0.40–1.50)
Calcium: 9.7 mg/dL (ref 8.4–10.5)
Chloride: 104 mEq/L (ref 96–112)
GFR: 76.39 mL/min (ref >60.00)
Glucose, Bld: 98 mg/dL (ref 70–99)
Potassium: 3.9 mEq/L (ref 3.5–5.1)
Sodium: 140 mEq/L (ref 135–145)
TOTAL PROTEIN: 7.1 g/dL (ref 6.0–8.3)

## 2018-05-28 LAB — TSH: TSH: 2.69 u[IU]/mL (ref 0.35–4.50)

## 2018-05-28 LAB — LDL CHOLESTEROL, DIRECT: Direct LDL: 156 mg/dL

## 2018-05-28 LAB — HEMOGLOBIN A1C: Hgb A1c MFr Bld: 5.3 % (ref 4.6–6.5)

## 2018-05-28 NOTE — Progress Notes (Signed)
Subjective:    Patient ID: Gerald Booth, male    DOB: 1956/02/03, 62 y.o.   MRN: [id number]  No chief complaint on file.   HPI Patient is in today for follow up. He has had a very rough year. He had to go to court to become his father's guardian after his father was found to be living in very poor conditions with his wife. It was very contentious and he has struggled with depression, anxiety and frustrations but he feels he is doing somewhat better. Notes anhedonia but no suicidal ideation. Denies CP/palp/SOB/HA/congestion/fevers/GI or GU c/o. Taking meds as prescribed  Past Medical History:  Diagnosis Date  . Anxiety and depression 01/27/2013  . Chicken pox as a child  . Depression with anxiety 01/27/2013  . ED (erectile dysfunction) 10/10/2012  . Elevated BP 10/03/2012  . Elevated BP 10/03/2012  . Hearing loss 10/10/2012  . 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 Hypothyroid 03/31/2017  . Kidney stones 10/10/2012  . Measles as a child  . Mumps as a child  . Obese 10/03/2012  . Onychomycosis 10/10/2012  . Preventative health care 10/10/2012  . Skin cancer of face 04/06/2016    Past Surgical History:  Procedure Laterality Date  . WISDOM TOOTH EXTRACTION  2012   1 tooth    Family History  Problem Relation Age of Onset  . Cancer Mother        breast, bone  . Hyperlipidemia Father   . Other Father        prostate surgery  . Dementia Father   . Heart attack Maternal Grandmother   . Kidney disease Maternal Grandfather        kidney failure  . AAA (abdominal aortic aneurysm) Brother     Social History   Socioeconomic History  . Marital status: Married    Spouse name: Not on file  . Number of children: Not on file  . Years of education: Not on file  . Highest education level: Not on file  Occupational History  . Not on file  Social Needs  . Financial resource strain: Not on file  . Food insecurity:    Worry: Not on file    Inability: Not on file  . Transportation  needs:    Medical: Not on file    Non-medical: Not on file  Tobacco Use  . Smoking status: Former Smoker    Types: Cigarettes    Start date: 12/11/1991  . Smokeless tobacco: Never Used  . Tobacco comment: smoked on weekends  Substance and Sexual Activity  . Alcohol use: Yes    Comment: 1 a day  . Drug use: No  . Sexual activity: Yes    Comment: no dietary changes, lives with wife  Lifestyle  . Physical activity:    Days per week: Not on file    Minutes per session: Not on file  . Stress: Not on file  Relationships  . Social connections:    Talks on phone: Not on file    Gets together: Not on file    Attends religious service: Not on file    Active member of club or organization: Not on file    Attends meetings of clubs or organizations: Not on file    Relationship status: Not on file  . Intimate partner violence:    Fear of current or ex partner: Not on file    Emotionally abused: Not on file    Physically abused: Not on file  Forced sexual activity: Not on file  Other Topics Concern  . Not on file  Social History Narrative   Makes plates for a living.   Lives at home with wife         Brother passed away March 12, 2016 from Aorta aneurism     Outpatient Medications Prior to Visit  Medication Sig Dispense Refill  . aspirin EC 81 MG tablet Take 1 tablet (81 mg total) by mouth daily.    . ciclopirox (CICLODAN) 8 % solution Apply topically at bedtime. Apply over nail and surrounding skin. Apply daily over previous coat. After seven (7) days, may remove with alcohol and continue cycle. 6.6 mL 1  . KRILL OIL OMEGA-3 PO Take by mouth.    Marland Kitchen acyclovir (ZOVIRAX) 800 MG tablet Take 1 tablet (800 mg total) by mouth 3 (three) times daily as needed. 180 tablet 0  . ALPRAZolam (XANAX) 0.25 MG tablet Take 1 tablet (0.25 mg total) by mouth 2 (two) times daily as needed for anxiety. 45 tablet 2  . celecoxib (CELEBREX) 200 MG capsule TAKE ONE CAPSULE BY MOUTH TWICE A DAY AT BEDTIME 180 capsule  1  . Efinaconazole (JUBLIA) 10 % SOLN Apply 1 application topically daily. 16 mL 1  . escitalopram (LEXAPRO) 5 MG tablet TAKE 1 TABLET BY MOUTH EVERY DAY 90 tablet 1  . levothyroxine (SYNTHROID, LEVOTHROID) 25 MCG tablet Take 1 tablet (25 mcg total) by mouth daily. 90 tablet 1  . neomycin-polymyxin-hydrocortisone (CORTISPORIN) otic solution Place 3 drops into both ears 2 (two) times daily. 10 mL 1  . ranitidine (ZANTAC) 300 MG tablet TAKE 1 TABLET BY MOUTH AS NEEDED 90 tablet 2  . tadalafil (CIALIS) 5 MG tablet Take 1 tablet (5 mg total) by mouth daily as needed for erectile dysfunction. 40 tablet 5   No facility-administered medications prior to visit.     Allergies  Allergen Reactions  . Penicillins     Nose bleeds    Review of Systems  Constitutional: Negative for fever and malaise/fatigue.  HENT: Negative for congestion.   Eyes: Negative for blurred vision.  Respiratory: Negative for shortness of breath.   Cardiovascular: Negative for chest pain, palpitations and leg swelling.  Gastrointestinal: Negative for abdominal pain, blood in stool and nausea.  Genitourinary: Negative for dysuria and frequency.  Musculoskeletal: Negative for falls.  Skin: Negative for rash.  Neurological: Negative for dizziness, loss of consciousness and headaches.  Endo/Heme/Allergies: Negative for environmental allergies.  Psychiatric/Behavioral: Positive for depression. The patient is nervous/anxious.        Objective:    Physical Exam  Constitutional: He is oriented to person, place, and time. No distress.  HENT:  Head: Normocephalic and atraumatic.  Eyes: Conjunctivae are normal.  Neck: Neck supple. No thyromegaly present.  Cardiovascular: Normal rate, regular rhythm and normal heart sounds.  No murmur heard. Pulmonary/Chest: Effort normal and breath sounds normal. No respiratory distress.  Abdominal: He exhibits no distension and no mass. There is no tenderness.  Musculoskeletal: He  exhibits no edema.  Neurological: He is alert and oriented to person, place, and time.  Skin: Skin is warm.  Psychiatric: Judgment normal.    BP 126/80 (BP Location: Left Arm, Patient Position: Sitting, Cuff Size: Normal)   Pulse 71   Temp (!) 97.1 F (36.2 C) (Oral)   Resp 18   Wt 249 lb 12.8 oz (113.3 kg)   SpO2 97%   BMI 31.22 kg/m  Wt Readings from Last 3 Encounters:  05/27/18 249  lb 12.8 oz (113.3 kg)  10/04/17 244 lb 9.6 oz (110.9 kg)  10/08/16 247 lb 6 oz (112.2 kg)     Lab Results  Component Value Date   WBC 5.6 05/27/2018   HGB 15.9 05/27/2018   HCT 44.8 05/27/2018   PLT 179.0 05/27/2018   GLUCOSE 98 05/27/2018   CHOL 244 (H) 05/27/2018   TRIG 242.0 (H) 05/27/2018   HDL 49.00 05/27/2018   LDLDIRECT 156.0 05/27/2018   LDLCALC 114 (H) 09/24/2017   ALT 17 05/27/2018   AST 17 05/27/2018   NA 140 05/27/2018   K 3.9 05/27/2018   CL 104 05/27/2018   CREATININE 1.04 05/27/2018   BUN 12 05/27/2018   CO2 28 05/27/2018   TSH 2.69 05/27/2018   PSA 0.79 02/29/2016   HGBA1C 5.3 05/27/2018    Lab Results  Component Value Date   TSH 2.69 05/27/2018   Lab Results  Component Value Date   WBC 5.6 05/27/2018   HGB 15.9 05/27/2018   HCT 44.8 05/27/2018   MCV 92.2 05/27/2018   PLT 179.0 05/27/2018   Lab Results  Component Value Date   NA 140 05/27/2018   K 3.9 05/27/2018   CO2 28 05/27/2018   GLUCOSE 98 05/27/2018   BUN 12 05/27/2018   CREATININE 1.04 05/27/2018   BILITOT 0.9 05/27/2018   ALKPHOS 70 05/27/2018   AST 17 05/27/2018   ALT 17 05/27/2018   PROT 7.1 05/27/2018   ALBUMIN 4.5 05/27/2018   CALCIUM 9.7 05/27/2018   GFR 76.39 05/27/2018   Lab Results  Component Value Date   CHOL 244 (H) 05/27/2018   Lab Results  Component Value Date   HDL 49.00 05/27/2018   Lab Results  Component Value Date   LDLCALC 114 (H) 09/24/2017   Lab Results  Component Value Date   TRIG 242.0 (H) 05/27/2018   Lab Results  Component Value Date   CHOLHDL 5  05/27/2018   Lab Results  Component Value Date   HGBA1C 5.3 05/27/2018       Assessment & Plan:   Problem List Items Addressed This Visit    HYPERCHOLESTEROLEMIA    Encouraged heart healthy diet, increase exercise, avoid trans fats, consider a krill oil cap daily      Relevant Medications   tadalafil (CIALIS) 5 MG tablet   Other Relevant Orders   Lipid panel (Completed)   GERD - Primary   Relevant Medications   ranitidine (ZANTAC) 300 MG tablet   Other Relevant Orders   CBC (Completed)   Depression with anxiety    Using Escitalopram 5 mg daily and he feels it helps well even under stress he notes he has been under a great deal of stress. His alprazolam was not helping his sleep. He may try taking 2 tabs prn to see if that helps more.       Relevant Medications   ALPRAZolam (XANAX) 0.25 MG tablet   escitalopram (LEXAPRO) 5 MG tablet   Hyperglycemia    hgba1c acceptable, minimize simple carbs. Increase exercise as tolerated.       Relevant Orders   Comprehensive metabolic panel (Completed)   Hemoglobin A1c (Completed)   Hypothyroid    On Levothyroxine, continue to monitor      Relevant Medications   levothyroxine (SYNTHROID, LEVOTHROID) 25 MCG tablet   Other Relevant Orders   TSH (Completed)    Other Visit Diagnoses    High risk medication use       Relevant Orders  Pain Mgmt, Profile 8 w/Conf, U (Completed)      I have discontinued Barth Kirks D. Rials's neomycin-polymyxin-hydrocortisone and Efinaconazole. I have also changed his escitalopram. Additionally, I am having him maintain his KRILL OIL OMEGA-3 PO, ciclopirox, aspirin EC, acyclovir, tadalafil, levothyroxine, ALPRAZolam, ranitidine, and celecoxib.  Meds ordered this encounter  Medications  . acyclovir (ZOVIRAX) 800 MG tablet    Sig: Take 1 tablet (800 mg total) by mouth 3 (three) times daily as needed.    Dispense:  180 tablet    Refill:  0  . tadalafil (CIALIS) 5 MG tablet    Sig: Take 1 tablet (5  mg total) by mouth daily as needed for erectile dysfunction.    Dispense:  40 tablet    Refill:  5  . levothyroxine (SYNTHROID, LEVOTHROID) 25 MCG tablet    Sig: Take 1 tablet (25 mcg total) by mouth daily.    Dispense:  90 tablet    Refill:  1  . ALPRAZolam (XANAX) 0.25 MG tablet    Sig: Take 1 tablet (0.25 mg total) by mouth 2 (two) times daily as needed for anxiety.    Dispense:  45 tablet    Refill:  2  . escitalopram (LEXAPRO) 5 MG tablet    Sig: Take 1 tablet (5 mg total) by mouth daily.    Dispense:  90 tablet    Refill:  1  . DISCONTD: celecoxib (CELEBREX) 200 MG capsule    Sig: TAKE ONE CAPSULE BY MOUTH TWICE A DAY AT BEDTIME    Dispense:  180 capsule    Refill:  1  . ranitidine (ZANTAC) 300 MG tablet    Sig: TAKE 1 TABLET BY MOUTH AS NEEDED    Dispense:  90 tablet    Refill:  2  . celecoxib (CELEBREX) 200 MG capsule    Sig: TAKE ONE CAPSULE BY MOUTH TWICE A DAY AT BEDTIME    Dispense:  180 capsule    Refill:  1     Penni Homans, MD

## 2018-06-09 ENCOUNTER — Ambulatory Visit (INDEPENDENT_AMBULATORY_CARE_PROVIDER_SITE_OTHER): Payer: BLUE CROSS/BLUE SHIELD | Admitting: Internal Medicine

## 2018-06-09 ENCOUNTER — Encounter: Payer: Self-pay | Admitting: Internal Medicine

## 2018-06-09 VITALS — BP 132/80 | HR 76 | Ht 75.0 in | Wt 251.4 lb

## 2018-06-09 DIAGNOSIS — R1013 Epigastric pain: Secondary | ICD-10-CM | POA: Diagnosis not present

## 2018-06-09 DIAGNOSIS — Z1211 Encounter for screening for malignant neoplasm of colon: Secondary | ICD-10-CM | POA: Diagnosis not present

## 2018-06-09 DIAGNOSIS — K219 Gastro-esophageal reflux disease without esophagitis: Secondary | ICD-10-CM | POA: Diagnosis not present

## 2018-06-09 MED ORDER — PANTOPRAZOLE SODIUM 40 MG PO TBEC: 40.0000 mg | DELAYED_RELEASE_TABLET | Freq: Every day | ORAL | 3 refills | Status: DC

## 2018-06-09 NOTE — Patient Instructions (Signed)
You have been scheduled for an endoscopy. Please follow written instructions given to you at your visit today. If you use inhalers (even only as needed), please bring them with you on the day of your procedure. Your physician has requested that you go to www.[email or web address].com and enter the access code given to you at your visit today. This web site gives a general overview about your procedure. However, you should still follow specific instructions given to you by our office regarding your preparation for the procedure.  We have sent the following medications to your pharmacy for you to pick up at your convenience: Pantoprazole 40 mg daily  Please purchase the following medications over the counter and take as directed: Ranitidine 150 mg every evening as needed  We have given you a GERD diet to look over and follow.  Your provider has ordered Cologuard testing as an option for colon cancer screening. This is performed by [facility] and may be out of network with your insurance. PRIOR to completing the test, it is YOUR responsibility to contact your insurance about covered benefits for this test. Your out of pocket expense could be anywhere from $0.00 to $649.00.   When you call to check coverage with your insurer, please provide the following information:   -The ONLY provider of Cologuard is [place] code for Cologuard is [phone].  [employer] NPI # [id number]  -[employer] Tax ID # [id number]   We have already sent your demographic and insurance information to [facility] (phone number [phone]) and they should contact you within the next week regarding your test. If you have not heard from them within the next week, please call our office at [phone].  If you are age 11 or older, your body mass index should be between 23-30. Your Body mass index is 31.42 kg/m. If this is out of the aforementioned range listed, please  consider follow up with your Primary Care Provider.  If you are age 77 or younger, your body mass index should be between 19-25. Your Body mass index is 31.42 kg/m. If this is out of the aformentioned range listed, please consider follow up with your Primary Care Provider.

## 2018-06-09 NOTE — Progress Notes (Signed)
Patient ID: Gerald Booth, male   DOB: [birth year], 62 y.o.   MRN: [id number] HPI: Gerald Booth is a 62 year old male with a past medical history of GERD, hypothyroidism, hypertension, anxiety and depression who was seen in consultation at the request of Gerald Booth to evaluate epigastric pain and reflux.  He is here alone today.  He reports that he has been having issues with acid reflux for about 5 years, perhaps longer.  Worse with spicy foods.  Worse if he eats late at night and most of symptoms are evening and nocturnal.  At times he will have sour brash but also typical heartburn and indigestion.  Separate from this he feels an epigastric discomfort which is an aching type pain worse in the left upper abdomen.  Typically goes away with Mylanta.  No dysphagia or odynophagia.  No nausea.  No weight loss.  No change in bowel habit.  Bowel movements are regular without blood in stool or melena.  He had a negative Cologuard 3 or 4 years ago.  By records this was September 2016  Gerald Booth gave him a prescription for ranitidine 300 mg initially he was using this in the evenings when needed but he states she later told him to try to use this most days.  He is been using it on more days than not in the evening.  He does admit to stress which will worsen symptoms.  He is recently moved his father into a memory care facility due to dementia.  He was laid off from his job not too long ago though he was near retirement.  He also lost his twin brother to a ruptured aortic aneurysm in 2016.  No family history of GI tract malignancy  Past Medical History:  Diagnosis Date  . Anxiety and depression 01/27/2013  . Chicken pox as a child  . Depression with anxiety 01/27/2013  . ED (erectile dysfunction) 10/10/2012  . Elevated BP 10/03/2012  . Elevated BP 10/03/2012  . Hearing loss 10/10/2012  . 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 Hypothyroid 03/31/2017  . Kidney stones 10/10/2012  . Measles as a child  . Mumps as a  child  . Obese 10/03/2012  . Onychomycosis 10/10/2012  . Preventative health care 10/10/2012  . Skin cancer of face 04/06/2016    Past Surgical History:  Procedure Laterality Date  . WISDOM TOOTH EXTRACTION  2012   1 tooth    Outpatient Medications Prior to Visit  Medication Sig Dispense Refill  . acyclovir (ZOVIRAX) 800 MG tablet Take 1 tablet (800 mg total) by mouth 3 (three) times daily as needed. 180 tablet 0  . ALPRAZolam (XANAX) 0.25 MG tablet Take 1 tablet (0.25 mg total) by mouth 2 (two) times daily as needed for anxiety. 45 tablet 2  . aspirin EC 81 MG tablet Take 1 tablet (81 mg total) by mouth daily.    . celecoxib (CELEBREX) 200 MG capsule TAKE ONE CAPSULE BY MOUTH TWICE A DAY AT BEDTIME (Patient taking differently: TAKE ONE CAPSULE BY MOUTH TWICE A DAY AT BEDTIME PRN) 180 capsule 1  . ciclopirox (CICLODAN) 8 % solution Apply topically at bedtime. Apply over nail and surrounding skin. Apply daily over previous coat. After seven (7) days, may remove with alcohol and continue cycle. 6.6 mL 1  . escitalopram (LEXAPRO) 5 MG tablet Take 1 tablet (5 mg total) by mouth daily. 90 tablet 1  . KRILL OIL OMEGA-3 PO Take by mouth.    . levothyroxine (SYNTHROID, LEVOTHROID)  25 MCG tablet Take 1 tablet (25 mcg total) by mouth daily. 90 tablet 1  . ranitidine (ZANTAC) 300 MG tablet TAKE 1 TABLET BY MOUTH AS NEEDED 90 tablet 2  . tadalafil (CIALIS) 5 MG tablet Take 1 tablet (5 mg total) by mouth daily as needed for erectile dysfunction. 40 tablet 5   No facility-administered medications prior to visit.     Allergies  Allergen Reactions  . Penicillins     Nose bleeds    Family History  Problem Relation Age of Onset  . Cancer Mother        breast, bone  . Hyperlipidemia Father   . Other Father        prostate surgery  . Dementia Father   . Heart attack Maternal Grandmother   . Kidney disease Maternal Grandfather        kidney failure  . AAA (abdominal aortic aneurysm) Brother      Social History   Tobacco Use  . Smoking status: Former Smoker    Types: Cigarettes    Start date: 12/11/1991  . Smokeless tobacco: Never Used  . Tobacco comment: smoked on weekends  Substance Use Topics  . Alcohol use: Yes    Comment: 1 a day  . Drug use: No    ROS: As per history of present illness, otherwise negative  BP 132/80   Pulse 76   Ht 6\' 3"  (1.905 m)   Wt 251 lb 6 oz (114 kg)   BMI 31.42 kg/m  Constitutional: Well-developed and well-nourished. No distress. HEENT: Normocephalic and atraumatic. Oropharynx is clear and moist. Conjunctivae are normal.  No scleral icterus. Neck: Neck supple. Trachea midline. Cardiovascular: Normal rate, regular rhythm and intact distal pulses. No M/R/G Pulmonary/chest: Effort normal and breath sounds normal. No wheezing, rales or rhonchi. Abdominal: Soft, nontender, nondistended. Bowel sounds active throughout.  Small umbilical hernia, nontender.  There are no masses palpable. No hepatosplenomegaly. Extremities: no clubbing, cyanosis, or edema Neurological: Alert and oriented to person place and time. Skin: Skin is warm and dry.  Psychiatric: Normal mood and affect. Behavior is normal.  RELEVANT LABS AND IMAGING: CBC    Component Value Date/Time   WBC 5.6 05/27/2018 1605   RBC 4.86 05/27/2018 1605   HGB 15.9 05/27/2018 1605   HCT 44.8 05/27/2018 1605   PLT 179.0 05/27/2018 1605   MCV 92.2 05/27/2018 1605   MCH 31.1 06/18/2014 1222   MCHC 35.5 05/27/2018 1605   RDW 13.1 05/27/2018 1605   LYMPHSABS 1.8 08/19/2009 1230   MONOABS 0.4 08/19/2009 1230   EOSABS 0.1 08/19/2009 1230   BASOSABS 0.1 08/19/2009 1230    CMP     Component Value Date/Time   NA 140 05/27/2018 1605   K 3.9 05/27/2018 1605   CL 104 05/27/2018 1605   CO2 28 05/27/2018 1605   GLUCOSE 98 05/27/2018 1605   BUN 12 05/27/2018 1605   CREATININE 1.04 05/27/2018 1605   CREATININE 0.93 06/18/2014 1222   CALCIUM 9.7 05/27/2018 1605   PROT 7.1 05/27/2018  1605   ALBUMIN 4.5 05/27/2018 1605   AST 17 05/27/2018 1605   ALT 17 05/27/2018 1605   ALKPHOS 70 05/27/2018 1605   BILITOT 0.9 05/27/2018 1605   GFRNONAA 82.35 08/19/2009 1230   GFRAA 101 08/15/2007 0928    ASSESSMENT/PLAN: 62 year old male with a past medical history of GERD, hypothyroidism, hypertension, anxiety and depression who was seen in consultation at the request of Gerald Booth to evaluate epigastric pain and reflux.  1. GERD/epigastric pain --patient has symptoms consistent with GERD and dyspepsia.  Also with epigastric pain.  I recommended that we try PPI, pantoprazole 40 mg daily, x2 months.  He can still use ranitidine 300 mg in the evening or at bedtime but would change this to as needed only for symptoms.  GERD diet.  Upper endoscopy recommended, rule out Barrett's, rule out ulcer disease.  We discussed the risk, benefits and alternatives and he is agreeable and wishes to proceed.  2.  CRC screening --negative Cologuard in September 2016.  We discussed colonoscopy but he prefers to continue with Cologuard.  I recommended we repeat this test in September of this year.  He understands that if this is positive he would need colonoscopy.    LV:XBOZW, Bonnita Levan, Md [address] [place], [place] [address]

## 2018-07-23 ENCOUNTER — Other Ambulatory Visit: Payer: Self-pay | Admitting: Family Medicine

## 2018-07-28 ENCOUNTER — Ambulatory Visit (AMBULATORY_SURGERY_CENTER): Payer: BLUE CROSS/BLUE SHIELD | Admitting: Internal Medicine

## 2018-07-28 ENCOUNTER — Encounter: Payer: Self-pay | Admitting: Internal Medicine

## 2018-07-28 VITALS — BP 118/82 | HR 67 | Temp 98.9°F | Resp 17 | Ht 75.0 in | Wt 251.0 lb

## 2018-07-28 DIAGNOSIS — K219 Gastro-esophageal reflux disease without esophagitis: Secondary | ICD-10-CM

## 2018-07-28 DIAGNOSIS — K3189 Other diseases of stomach and duodenum: Secondary | ICD-10-CM | POA: Diagnosis not present

## 2018-07-28 DIAGNOSIS — Z1211 Encounter for screening for malignant neoplasm of colon: Secondary | ICD-10-CM

## 2018-07-28 MED ORDER — SODIUM CHLORIDE 0.9 % IV SOLN: 500.0000 mL | Freq: Once | INTRAVENOUS | Status: DC

## 2018-07-28 NOTE — Op Note (Signed)
[facility] Patient Name: Gerald Booth Procedure Date: 07/28/2018 3:04 PM MRN: [id number] Endoscopist: Jerene Bears , MD Age: 62 Referring MD:  Date of Birth: 1955/12/20 Gender: Male Account #: [id number] Procedure:                Upper GI endoscopy Indications:              Epigastric abdominal pain, Gastro-esophageal reflux                            disease Medicines:                Monitored Anesthesia Care Procedure:                Pre-Anesthesia Assessment:                           - Prior to the procedure, a History and Physical                            was performed, and patient medications and                            allergies were reviewed. The patient's tolerance of                            previous anesthesia was also reviewed. The risks                            and benefits of the procedure and the sedation                            options and risks were discussed with the patient.                            All questions were answered, and informed consent                            was obtained. Prior Anticoagulants: The patient has                            taken no previous anticoagulant or antiplatelet                            agents. ASA Grade Assessment: II - A patient with                            mild systemic disease. After reviewing the risks                            and benefits, the patient was deemed in                            satisfactory condition to undergo the procedure.  After obtaining informed consent, the endoscope was                            passed under direct vision. Throughout the                            procedure, the patient's blood pressure, pulse, and                            oxygen saturations were monitored continuously. The                            Endoscope was introduced through the mouth, and                            advanced to the second part of duodenum. The upper                             GI endoscopy was accomplished without difficulty.                            The patient tolerated the procedure well. Scope In: Scope Out: Findings:                 The examined esophagus was normal. Z-line is                            regular at 42 cm from the incisors.                           A 1 cm hiatal hernia was present.                           The entire examined stomach was normal. Biopsies                            were taken with a cold forceps for histology and                            Helicobacter pylori testing given epigastric pain.                           The examined duodenum was normal. Complications:            No immediate complications. Estimated Blood Loss:     Estimated blood loss was minimal. Impression:               - Normal esophagus.                           - 1 cm hiatal hernia.                           - Normal stomach. Biopsied.                           -  Normal examined duodenum. Recommendation:           - Patient has a contact number available for                            emergencies. The signs and symptoms of potential                            delayed complications were discussed with the                            patient. Return to normal activities tomorrow.                            Written discharge instructions were provided to the                            patient.                           - Resume previous diet.                           - Continue present medications.                           - Can continue pantoprazole 40 mg once daily (in                            the morning before breakfast) and then use                            ranitidine at bedtime as needed for acid reflux and                            dyspepsia.                           - Await pathology results. Jerene Bears, MD 07/28/2018 3:15:39 PM This report has been signed electronically.

## 2018-07-28 NOTE — Progress Notes (Signed)
1518 Pt experience laryngeal spasm with jaw thrust performed, O2 sat > 92% during entire event, vss. Dr Hilarie Fredrickson made aware.

## 2018-07-28 NOTE — Progress Notes (Signed)
1535  O2 sat 91-92 percent albuterol neb given with sat 96% on RA, vss.  Pt alert and awake, vss

## 2018-07-28 NOTE — Patient Instructions (Signed)
GERD protocol for hiatal hernia given.  YOU HAD AN ENDOSCOPIC PROCEDURE TODAY AT [facility]:   Refer to the procedure report that was given to you for any specific questions about what was found during the examination.  If the procedure report does not answer your questions, please call your gastroenterologist to clarify.  If you requested that your care partner not be given the details of your procedure findings, then the procedure report has been included in a sealed envelope for you to review at your convenience later.  YOU SHOULD EXPECT: Some feelings of bloating in the abdomen. Passage of more gas than usual.  Walking can help get rid of the air that was put into your GI tract during the procedure and reduce the bloating. If you had a lower endoscopy (such as a colonoscopy or flexible sigmoidoscopy) you may notice spotting of blood in your stool or on the toilet paper. If you underwent a bowel prep for your procedure, you may not have a normal bowel movement for a few days.  Please Note:  You might notice some irritation and congestion in your nose or some drainage.  This is from the oxygen used during your procedure.  There is no need for concern and it should clear up in a day or so.  SYMPTOMS TO REPORT IMMEDIATELY:    Following upper endoscopy (EGD)  Vomiting of blood or coffee ground material  New chest pain or pain under the shoulder blades  Painful or persistently difficult swallowing  New shortness of breath  Fever of 100F or higher  Black, tarry-looking stools  For urgent or emergent issues, a gastroenterologist can be reached at any hour by calling [phone].   DIET:  We do recommend a small meal at first, but then you may proceed to your regular diet.  Drink plenty of fluids but you should avoid alcoholic beverages for 24 hours.  ACTIVITY:  You should plan to take it easy for the rest of today and you should NOT DRIVE or use heavy machinery until  tomorrow (because of the sedation medicines used during the test).    FOLLOW UP: Our staff will call the number listed on your records the next business day following your procedure to check on you and address any questions or concerns that you may have regarding the information given to you following your procedure. If we do not reach you, we will leave a message.  However, if you are feeling well and you are not experiencing any problems, there is no need to return our call.  We will assume that you have returned to your regular daily activities without incident.  If any biopsies were taken you will be contacted by phone or by letter within the next 1-3 weeks.  Please call us at [phone] if you have not heard about the biopsies in 3 weeks.    SIGNATURES/CONFIDENTIALITY: You and/or your care partner have signed paperwork which will be entered into your electronic medical record.  These signatures attest to the fact that that the information above on your After Visit Summary has been reviewed and is understood.  Full responsibility of the confidentiality of this discharge information lies with you and/or your care-partner.

## 2018-07-28 NOTE — Progress Notes (Signed)
Report given to PACU, vss 

## 2018-07-29 ENCOUNTER — Telehealth: Payer: Self-pay | Admitting: *Deleted

## 2018-07-29 NOTE — Telephone Encounter (Signed)
  Follow up Call-  Call back number 07/28/2018  Post procedure Call Back phone  # [phone]  Permission to leave phone message No  Some recent data might be hidden     Patient questions:  Do you have a fever, pain , or abdominal swelling? No. Pain Score  0 *  Have you tolerated food without any problems? Yes.    Have you been able to return to your normal activities? Yes.    Do you have any questions about your discharge instructions: Diet   No. Medications  No. Follow up visit  No.  Do you have questions or concerns about your Care? No.  Actions: * If pain score is 4 or above: No action needed, pain <4.

## 2018-07-29 NOTE — Telephone Encounter (Signed)
Recall placed for cologuard in 08/2018.

## 2018-07-29 NOTE — Telephone Encounter (Signed)
-----   Message from Jerene Bears, MD sent at 07/28/2018  3:44 PM EDT ----- Regarding: CRC screening Cologuard due Sept 2019, not covered I ordered iFOB for this year Place remind for Cologuard next year when he will be on Medicare and this will be covered Pt aware of plan and in agreement. JMP

## 2018-08-04 ENCOUNTER — Encounter: Payer: Self-pay | Admitting: Internal Medicine

## 2018-08-25 ENCOUNTER — Other Ambulatory Visit: Payer: Self-pay | Admitting: Family Medicine

## 2018-08-28 ENCOUNTER — Ambulatory Visit: Payer: BLUE CROSS/BLUE SHIELD | Admitting: Family Medicine

## 2018-09-03 ENCOUNTER — Other Ambulatory Visit: Payer: Self-pay | Admitting: Internal Medicine

## 2018-09-08 ENCOUNTER — Other Ambulatory Visit: Payer: Self-pay

## 2018-09-08 MED ORDER — FAMOTIDINE 40 MG PO TABS: 40.0000 mg | ORAL_TABLET | Freq: Every day | ORAL | 3 refills | Status: DC

## 2018-09-18 ENCOUNTER — Other Ambulatory Visit: Payer: Self-pay | Admitting: Family Medicine

## 2018-10-15 ENCOUNTER — Other Ambulatory Visit: Payer: Self-pay | Admitting: Family Medicine

## 2018-11-18 ENCOUNTER — Telehealth: Payer: Self-pay | Admitting: Family Medicine

## 2018-11-18 NOTE — Telephone Encounter (Signed)
Gerald Booth- Patient needs follow up visit for next year. thx

## 2018-11-18 NOTE — Telephone Encounter (Signed)
I refilled but needs appt I think

## 2018-11-19 ENCOUNTER — Other Ambulatory Visit: Payer: Self-pay | Admitting: Internal Medicine

## 2018-11-19 NOTE — Telephone Encounter (Signed)
Left pt a partial message stating he needs a follow up with Dr. Charlett Blake. The machine said memory was full before I could give the call back number. Hopefully he will get partial message.

## 2018-11-26 ENCOUNTER — Other Ambulatory Visit: Payer: Self-pay | Admitting: Family Medicine

## 2018-11-26 ENCOUNTER — Encounter: Payer: Self-pay | Admitting: Family Medicine

## 2018-11-26 NOTE — Telephone Encounter (Signed)
Last office visit on 05/27/2018 Last refill on 05/27/2018  #45 with 2 refills UDS 05/27/2018 low risk

## 2018-12-02 NOTE — Telephone Encounter (Signed)
Pt scheduled 01/01/18 @ 11:15am

## 2018-12-02 NOTE — Telephone Encounter (Signed)
01/01/2019

## 2019-01-01 ENCOUNTER — Ambulatory Visit: Payer: BLUE CROSS/BLUE SHIELD | Admitting: Family Medicine

## 2019-01-29 ENCOUNTER — Ambulatory Visit: Payer: BLUE CROSS/BLUE SHIELD | Admitting: Family Medicine

## 2019-02-20 ENCOUNTER — Encounter: Payer: Self-pay | Admitting: Family Medicine

## 2019-02-23 ENCOUNTER — Encounter: Payer: Self-pay | Admitting: Family Medicine

## 2019-02-23 ENCOUNTER — Ambulatory Visit: Payer: BLUE CROSS/BLUE SHIELD | Admitting: Family Medicine

## 2019-03-08 ENCOUNTER — Other Ambulatory Visit: Payer: Self-pay | Admitting: Internal Medicine

## 2019-04-15 ENCOUNTER — Telehealth: Payer: Self-pay | Admitting: *Deleted

## 2019-04-15 NOTE — Telephone Encounter (Signed)
Spoke with pt's spouse and changed to [employer] video visit. She voices understanding about accessing / beginning visit and will have pt call if any questions.  Copied from [place] [phone]. Topic: Appointment Scheduling - Scheduling Inquiry for Clinic >> Apr 14, 2019  4:32 PM Alanda Slim E wrote: Reason for CRM: Pt called about rescheduling his appt for 5.11.20 and wanted to know if he can have it as a virtual appt/ please advise

## 2019-04-20 ENCOUNTER — Encounter: Payer: Self-pay | Admitting: Family Medicine

## 2019-04-20 ENCOUNTER — Telehealth (INDEPENDENT_AMBULATORY_CARE_PROVIDER_SITE_OTHER): Payer: BLUE CROSS/BLUE SHIELD | Admitting: Family Medicine

## 2019-04-20 DIAGNOSIS — E7800 Pure hypercholesterolemia, unspecified: Secondary | ICD-10-CM | POA: Diagnosis not present

## 2019-04-20 DIAGNOSIS — Z7189 Other specified counseling: Secondary | ICD-10-CM | POA: Diagnosis not present

## 2019-04-20 DIAGNOSIS — E039 Hypothyroidism, unspecified: Secondary | ICD-10-CM | POA: Diagnosis not present

## 2019-04-20 DIAGNOSIS — R739 Hyperglycemia, unspecified: Secondary | ICD-10-CM | POA: Diagnosis not present

## 2019-04-20 MED ORDER — CICLOPIROX 8 % EX SOLN: Freq: Every day | CUTANEOUS | 1 refills | Status: AC

## 2019-04-20 MED ORDER — ESCITALOPRAM OXALATE 5 MG PO TABS: 10.0000 mg | ORAL_TABLET | Freq: Every day | ORAL | 1 refills | Status: DC

## 2019-04-20 MED ORDER — FAMOTIDINE 40 MG PO TABS: 40.0000 mg | ORAL_TABLET | Freq: Every evening | ORAL | 1 refills | Status: DC

## 2019-04-20 MED ORDER — LEVOTHYROXINE SODIUM 25 MCG PO TABS: 25.0000 ug | ORAL_TABLET | Freq: Every day | ORAL | 1 refills | Status: DC

## 2019-04-20 MED ORDER — CELECOXIB 200 MG PO CAPS: ORAL_CAPSULE | ORAL | 1 refills | Status: DC

## 2019-04-20 MED ORDER — ALPRAZOLAM 0.25 MG PO TABS: ORAL_TABLET | ORAL | 1 refills | Status: DC

## 2019-04-20 MED ORDER — TADALAFIL 5 MG PO TABS: 5.0000 mg | ORAL_TABLET | Freq: Every day | ORAL | 5 refills | Status: AC | PRN

## 2019-04-20 MED ORDER — ACYCLOVIR 800 MG PO TABS: 800.0000 mg | ORAL_TABLET | Freq: Three times a day (TID) | ORAL | 1 refills | Status: DC | PRN

## 2019-04-20 NOTE — Assessment & Plan Note (Signed)
On Levothyroxine, continue to monitor 

## 2019-04-20 NOTE — Assessment & Plan Note (Signed)
Masking, hand washing, cleaning, vitamin C, Zinc, deep breathing

## 2019-04-20 NOTE — Progress Notes (Signed)
Virtual Visit via Video Note  I connected with [name] on 04/20/19 at  2:00 PM EDT by a video enabled telemedicine application and verified that I am speaking with the correct person using two identifiers.  Location: Patient: home Provider: office   I discussed the limitations of evaluation and management by telemedicine and the availability of in person appointments. The patient expressed understanding and agreed to proceed. [name], CMA was able     Subjective:    Patient ID: Gerald Booth, male    DOB: 06-Mar-1956, 63 y.o.   MRN: [id number]  No chief complaint on file.   HPI Patient is in today for follow up on chronic medical concerns including hypothyroidism, hyperglycemia, anxiety and depression. He lost his sister back earlier this year to lung cancer with mets recently. He is managing his sadness well. He is quaranting well and was already retired. No c/o polyuria or polydipsia. Denies CP/palp/SOB/HA/congestion/fevers/GI or GU c/o. Taking meds as prescribed  Past Medical History:  Diagnosis Date  . Anxiety and depression 01/27/2013  . Chicken pox as a child  . Depression with anxiety 01/27/2013  . ED (erectile dysfunction) 10/10/2012  . Elevated BP 10/03/2012  . Elevated BP 10/03/2012  . GERD (gastroesophageal reflux disease)   . Hearing loss 10/10/2012  . 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 Hypothyroid 03/31/2017  . Kidney stones 10/10/2012  . Measles as a child  . Mumps as a child  . Obese 10/03/2012  . Onychomycosis 10/10/2012  . Preventative health care 10/10/2012  . Skin cancer of face 04/06/2016    Past Surgical History:  Procedure Laterality Date  . WISDOM TOOTH EXTRACTION  2012   1 tooth    Family History  Problem Relation Age of Onset  . Cancer Mother        breast, bone  . Hyperlipidemia Father   . Other Father        prostate surgery  . Dementia Father   . Heart attack Maternal Grandmother   . Kidney disease Maternal Grandfather         kidney failure  . Cancer Sister        metastatic lung cancer  . AAA (abdominal aortic aneurysm) Brother     Social History   Socioeconomic History  . Marital status: Married    Spouse name: Not on file  . Number of children: 1  . Years of education: Not on file  . Highest education level: Not on file  Occupational History  . Occupation: Retired  Scientific laboratory technician  . Financial resource strain: Not on file  . Food insecurity:    Worry: Not on file    Inability: Not on file  . Transportation needs:    Medical: Not on file    Non-medical: Not on file  Tobacco Use  . Smoking status: Former Smoker    Types: Cigarettes    Start date: 12/11/1991  . Smokeless tobacco: Never Used  . Tobacco comment: smoked on weekends  Substance and Sexual Activity  . Alcohol use: Yes    Comment: 1 a day  . Drug use: No  . Sexual activity: Yes    Comment: no dietary changes, lives with wife  Lifestyle  . Physical activity:    Days per week: Not on file    Minutes per session: Not on file  . Stress: Not on file  Relationships  . Social connections:    Talks on phone: Not on file    Gets  together: Not on file    Attends religious service: Not on file    Active member of club or organization: Not on file    Attends meetings of clubs or organizations: Not on file    Relationship status: Not on file  . Intimate partner violence:    Fear of current or ex partner: Not on file    Emotionally abused: Not on file    Physically abused: Not on file    Forced sexual activity: Not on file  Other Topics Concern  . Not on file  Social History Narrative   Makes plates for a living.   Lives at home with wife         Brother passed away 03-07-2016 from Aorta aneurism     Outpatient Medications Prior to Visit  Medication Sig Dispense Refill  . aspirin EC 81 MG tablet Take 1 tablet (81 mg total) by mouth daily.    Marland Kitchen escitalopram (LEXAPRO) 5 MG tablet TAKE 1 TABLET BY MOUTH EVERY DAY 90 tablet 1  .  fexofenadine (ALLEGRA) 180 MG tablet Take 180 mg by mouth daily.    . pantoprazole (PROTONIX) 40 MG tablet TAKE 1 TABLET BY MOUTH EVERY DAY 90 tablet 0  . acyclovir (ZOVIRAX) 800 MG tablet TAKE 1 TABLET (800 MG TOTAL) BY MOUTH 3 (THREE) TIMES DAILY AS NEEDED. 90 tablet 1  . ALPRAZolam (XANAX) 0.25 MG tablet TAKE 1 TABLET BY MOUTH TWICE A DAY AS NEEDED FOR ANXIETY 45 tablet 1  . celecoxib (CELEBREX) 200 MG capsule TAKE ONE CAPSULE BY MOUTH TWICE A DAY AT BEDTIME (Patient taking differently: TAKE ONE CAPSULE BY MOUTH TWICE A DAY AT BEDTIME PRN) 180 capsule 1  . famotidine (PEPCID) 40 MG tablet Take 1 tablet (40 mg total) by mouth every evening. AS NEEDED 90 tablet 0  . levothyroxine (SYNTHROID, LEVOTHROID) 25 MCG tablet TAKE 1 TABLET BY MOUTH EVERY DAY 90 tablet 1  . tadalafil (CIALIS) 5 MG tablet Take 1 tablet (5 mg total) by mouth daily as needed for erectile dysfunction. 40 tablet 5  . celecoxib (CELEBREX) 200 MG capsule TAKE ONE CAPSULE BY MOUTH TWICE A DAY AT BEDTIME 180 capsule 1  . ranitidine (ZANTAC) 300 MG tablet TAKE 1 TABLET BY MOUTH AS NEEDED 90 tablet 2  . 0.9 %  sodium chloride infusion      No facility-administered medications prior to visit.     Allergies  Allergen Reactions  . Penicillins     Nose bleeds    Review of Systems  Constitutional: Negative for fever and malaise/fatigue.  HENT: Negative for congestion.   Eyes: Negative for blurred vision.  Respiratory: Negative for shortness of breath.   Cardiovascular: Negative for chest pain, palpitations and leg swelling.  Gastrointestinal: Negative for abdominal pain, blood in stool and nausea.  Genitourinary: Negative for dysuria and frequency.  Musculoskeletal: Negative for falls.  Skin: Negative for rash.  Neurological: Negative for dizziness, loss of consciousness and headaches.  Endo/Heme/Allergies: Negative for environmental allergies.  Psychiatric/Behavioral: Negative for depression. The patient is not  nervous/anxious.        Objective:    Physical Exam Constitutional:      Appearance: Normal appearance. He is not ill-appearing.  HENT:     Head: Normocephalic and atraumatic.  Eyes:     General:        Right eye: No discharge.        Left eye: No discharge.  Pulmonary:     Effort: Pulmonary effort  is normal.  Neurological:     Mental Status: He is alert and oriented to person, place, and time.  Psychiatric:        Behavior: Behavior normal.     Pulse 72   Wt 245 lb (111.1 kg)   BMI 30.62 kg/m  Wt Readings from Last 3 Encounters:  04/20/19 245 lb (111.1 kg)  07/28/18 251 lb (113.9 kg)  06/09/18 251 lb 6 oz (114 kg)    Diabetic Foot Exam - Simple   No data filed     Lab Results  Component Value Date   WBC 5.6 05/27/2018   HGB 15.9 05/27/2018   HCT 44.8 05/27/2018   PLT 179.0 05/27/2018   GLUCOSE 98 05/27/2018   CHOL 244 (H) 05/27/2018   TRIG 242.0 (H) 05/27/2018   HDL 49.00 05/27/2018   LDLDIRECT 156.0 05/27/2018   LDLCALC 114 (H) 09/24/2017   ALT 17 05/27/2018   AST 17 05/27/2018   NA 140 05/27/2018   K 3.9 05/27/2018   CL 104 05/27/2018   CREATININE 1.04 05/27/2018   BUN 12 05/27/2018   CO2 28 05/27/2018   TSH 2.69 05/27/2018   PSA 0.79 02/29/2016   HGBA1C 5.3 05/27/2018    Lab Results  Component Value Date   TSH 2.69 05/27/2018   Lab Results  Component Value Date   WBC 5.6 05/27/2018   HGB 15.9 05/27/2018   HCT 44.8 05/27/2018   MCV 92.2 05/27/2018   PLT 179.0 05/27/2018   Lab Results  Component Value Date   NA 140 05/27/2018   K 3.9 05/27/2018   CO2 28 05/27/2018   GLUCOSE 98 05/27/2018   BUN 12 05/27/2018   CREATININE 1.04 05/27/2018   BILITOT 0.9 05/27/2018   ALKPHOS 70 05/27/2018   AST 17 05/27/2018   ALT 17 05/27/2018   PROT 7.1 05/27/2018   ALBUMIN 4.5 05/27/2018   CALCIUM 9.7 05/27/2018   GFR 76.39 05/27/2018   Lab Results  Component Value Date   CHOL 244 (H) 05/27/2018   Lab Results  Component Value Date   HDL  49.00 05/27/2018   Lab Results  Component Value Date   LDLCALC 114 (H) 09/24/2017   Lab Results  Component Value Date   TRIG 242.0 (H) 05/27/2018   Lab Results  Component Value Date   CHOLHDL 5 05/27/2018   Lab Results  Component Value Date   HGBA1C 5.3 05/27/2018       Assessment & Plan:   Problem List Items Addressed This Visit    HYPERCHOLESTEROLEMIA    Encouraged heart healthy diet, increase exercise, avoid trans fats, consider a krill oil cap daily      Relevant Medications   tadalafil (CIALIS) 5 MG tablet   Hyperglycemia    hgba1c acceptable, minimize simple carbs. Increase exercise as tolerated.      Hypothyroid    On Levothyroxine, continue to monitor      Relevant Medications   levothyroxine (SYNTHROID) 25 MCG tablet   Educated About Covid-19 Virus Infection    Masking, hand washing, cleaning, vitamin C, Zinc, deep breathing         I have discontinued Barth Kirks D. Douville's ranitidine. I have also changed his levothyroxine. Additionally, I am having him start on escitalopram. Lastly, I am having him maintain his aspirin EC, fexofenadine, escitalopram, pantoprazole, ALPRAZolam, famotidine, acyclovir, tadalafil, celecoxib, and ciclopirox. We will stop administering sodium chloride.  Meds ordered this encounter  Medications  . ALPRAZolam (XANAX) 0.25 MG tablet  Sig: TAKE 1 TABLET BY MOUTH TWICE A DAY AS NEEDED FOR ANXIETY    Dispense:  45 tablet    Refill:  1    This request is for a new prescription for a controlled substance as required by Federal/State law.NEED NEW RX  FOR 0.5MG  TAKE 1/2 TABLET 2 TIMES A DAY.  . famotidine (PEPCID) 40 MG tablet    Sig: Take 1 tablet (40 mg total) by mouth every evening. AS NEEDED    Dispense:  90 tablet    Refill:  1  . acyclovir (ZOVIRAX) 800 MG tablet    Sig: Take 1 tablet (800 mg total) by mouth 3 (three) times daily as needed.    Dispense:  90 tablet    Refill:  1  . tadalafil (CIALIS) 5 MG tablet    Sig:  Take 1 tablet (5 mg total) by mouth daily as needed for erectile dysfunction.    Dispense:  40 tablet    Refill:  5  . celecoxib (CELEBREX) 200 MG capsule    Sig: TAKE ONE CAPSULE BY MOUTH TWICE A DAY AT BEDTIME    Dispense:  180 capsule    Refill:  1  . escitalopram (LEXAPRO) 5 MG tablet    Sig: Take 2 tablets (10 mg total) by mouth daily.    Dispense:  270 tablet    Refill:  1  . levothyroxine (SYNTHROID) 25 MCG tablet    Sig: Take 1 tablet (25 mcg total) by mouth daily.    Dispense:  90 tablet    Refill:  1  . ciclopirox (CICLODAN) 8 % solution    Sig: Apply topically at bedtime. Apply over nail and surrounding skin. Apply daily over previous coat. After seven (7) days, may remove with alcohol and continue cycle.    Dispense:  6.6 mL    Refill:  1    I discussed the assessment and treatment plan with the patient. The patient was provided an opportunity to ask questions and all were answered. The patient agreed with the plan and demonstrated an understanding of the instructions.   The patient was advised to call back or seek an in-person evaluation if the symptoms worsen or if the condition fails to improve as anticipated.  I provided 25 minutes of non-face-to-face time during this encounter.   Penni Homans, MD

## 2019-04-20 NOTE — Assessment & Plan Note (Signed)
Encouraged heart healthy diet, increase exercise, avoid trans fats, consider a krill oil cap daily 

## 2019-04-20 NOTE — Assessment & Plan Note (Signed)
hgba1c acceptable, minimize simple carbs. Increase exercise as tolerated.  

## 2019-05-17 ENCOUNTER — Other Ambulatory Visit: Payer: Self-pay | Admitting: Family Medicine

## 2019-05-22 ENCOUNTER — Ambulatory Visit (INDEPENDENT_AMBULATORY_CARE_PROVIDER_SITE_OTHER): Payer: Medicare Other | Admitting: Medical

## 2019-05-22 ENCOUNTER — Encounter: Payer: Self-pay | Admitting: Medical

## 2019-05-22 ENCOUNTER — Encounter: Payer: Self-pay | Admitting: Family Medicine

## 2019-05-22 ENCOUNTER — Other Ambulatory Visit: Payer: Self-pay

## 2019-05-22 VITALS — HR 77 | Ht 75.0 in | Wt 245.0 lb

## 2019-05-22 DIAGNOSIS — L0390 Cellulitis, unspecified: Secondary | ICD-10-CM | POA: Diagnosis not present

## 2019-05-22 DIAGNOSIS — H1032 Unspecified acute conjunctivitis, left eye: Secondary | ICD-10-CM

## 2019-05-22 MED ORDER — TOBRAMYCIN 0.3 % OP SOLN: 2.0000 [drp] | Freq: Four times a day (QID) | OPHTHALMIC | 0 refills | Status: DC

## 2019-05-22 MED ORDER — DOXYCYCLINE HYCLATE 100 MG PO TABS: 100.0000 mg | ORAL_TABLET | Freq: Two times a day (BID) | ORAL | 0 refills | Status: DC

## 2019-05-22 NOTE — Patient Instructions (Signed)
History of stye 2 weeks ago but now on exam concern for potential cellulitis of upper lid with some conjunctivitis since report of yellow dc in am.  Will rx tobrex eye drops and doxycycline. Rx advisement given.  Follow up in one week or as needed

## 2019-05-22 NOTE — Progress Notes (Signed)
Subjective:    Patient ID: Gerald Booth, male    DOB: July 28, 1956, 63 y.o.   MRN: [id number]  HPI  Virtual Visit via Video Note  I connected with [name] on 05/22/19 at  1:40 PM EDT by a video enabled telemedicine application and verified that I am speaking with the correct person using two identifiers.  Location: Patient: home Provider:home  Pt does not have bp cuff. No bp check or pulse.   I discussed the limitations of evaluation and management by telemedicine and the availability of in person appointments. The patient expressed understanding and agreed to proceed.  History of Present Illness:   Pt states about 2 weeks ago he had stye upper lid at left side of lid. Then got gradually red and swelling. He uses warm compresses 2-3 times a day. It helps and feels better briefly. Occasional yellow dc in morning, No vision changes. No fever.   Pt did use some over the counter initially that helped with symptoms.    Observations/Objective: General-no acute distress, pleasant, oriented. Lungs- on inspection lungs appear unlabored. Neck- no tracheal deviation or jvd on inspection. Neuro- gross motor function appears intact. Eye- left upper eye lid moderate red and swollen. Puffy. Mild tender to palpation. conjuncitva clear.  Assessment and Plan:  History of stye 2 weeks ago but now on exam concern for potential cellulitis of upper lid with some conjunctivitis since report of yellow dc in am.  Will rx tobrex eye drops and doxycycline. Rx advisement given.  Follow up in one week or as needed    Follow Up Instructions:    I discussed the assessment and treatment plan with the patient. The patient was provided an opportunity to ask questions and all were answered. The patient agreed with the plan and demonstrated an understanding of the instructions.   The patient was advised to call back or seek an in-person evaluation if the symptoms worsen or if the  condition fails to improve as anticipated.  I provided 20 minutes of non-face-to-face time during this encounter.   Mackie Pai, PA-C   Review of Systems  Constitutional: Negative for chills, fatigue and fever.  Eyes: Positive for discharge. Negative for photophobia, pain, redness, itching and visual disturbance.  Respiratory: Negative for cough, chest tightness, shortness of breath and wheezing.   Cardiovascular: Negative for chest pain and palpitations.  Neurological: Negative for dizziness, numbness and headaches.  Hematological: Negative for adenopathy. Does not bruise/bleed easily.  Psychiatric/Behavioral: Negative for behavioral problems and confusion.   Past Medical History:  Diagnosis Date  . Anxiety and depression 01/27/2013  . Chicken pox as a child  . Depression with anxiety 01/27/2013  . ED (erectile dysfunction) 10/10/2012  . Elevated BP 10/03/2012  . Elevated BP 10/03/2012  . GERD (gastroesophageal reflux disease)   . Hearing loss 10/10/2012  . 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 Hypothyroid 03/31/2017  . Kidney stones 10/10/2012  . Measles as a child  . Mumps as a child  . Obese 10/03/2012  . Onychomycosis 10/10/2012  . Preventative health care 10/10/2012  . Skin cancer of face 04/06/2016     Social History   Socioeconomic History  . Marital status: Married    Spouse name: Not on file  . Number of children: 1  . Years of education: Not on file  . Highest education level: Not on file  Occupational History  . Occupation: Retired  Scientific laboratory technician  . Financial resource strain: Not on file  .  Food insecurity    Worry: Not on file    Inability: Not on file  . Transportation needs    Medical: Not on file    Non-medical: Not on file  Tobacco Use  . Smoking status: Former Smoker    Types: Cigarettes    Start date: 12/11/1991  . Smokeless tobacco: Never Used  . Tobacco comment: smoked on weekends  Substance and Sexual Activity  . Alcohol use: Yes    Comment: 1 a  day  . Drug use: No  . Sexual activity: Yes    Comment: no dietary changes, lives with wife  Lifestyle  . Physical activity    Days per week: Not on file    Minutes per session: Not on file  . Stress: Not on file  Relationships  . Social Herbalist on phone: Not on file    Gets together: Not on file    Attends religious service: Not on file    Active member of club or organization: Not on file    Attends meetings of clubs or organizations: Not on file    Relationship status: Not on file  . Intimate partner violence    Fear of current or ex partner: Not on file    Emotionally abused: Not on file    Physically abused: Not on file    Forced sexual activity: Not on file  Other Topics Concern  . Not on file  Social History Narrative   Makes plates for a living.   Lives at home with wife         Brother passed away 17-Mar-2016 from Aorta aneurism     Past Surgical History:  Procedure Laterality Date  . WISDOM TOOTH EXTRACTION  2011-03-18   1 tooth    Family History  Problem Relation Age of Onset  . Cancer Mother        breast, bone  . Hyperlipidemia Father   . Other Father        prostate surgery  . Dementia Father   . Heart attack Maternal Grandmother   . Kidney disease Maternal Grandfather        kidney failure  . Cancer Sister        metastatic lung cancer  . AAA (abdominal aortic aneurysm) Brother     Allergies  Allergen Reactions  . Penicillins     Nose bleeds    Current Outpatient Medications on File Prior to Visit  Medication Sig Dispense Refill  . acyclovir (ZOVIRAX) 800 MG tablet TAKE 1 TABLET (800 MG TOTAL) BY MOUTH 3 (THREE) TIMES DAILY AS NEEDED. 90 tablet 1  . ALPRAZolam (XANAX) 0.25 MG tablet TAKE 1 TABLET BY MOUTH TWICE A DAY AS NEEDED FOR ANXIETY 45 tablet 1  . ALPRAZolam (XANAX) 0.5 MG tablet TAKE 1/2 TABLET BY MOUTH TWICE A DAY AS NEEDED FOR ANXIETY **0.25MG  ON BACKORDER**    . aspirin EC 81 MG tablet Take 1 tablet (81 mg total) by mouth  daily.    . celecoxib (CELEBREX) 200 MG capsule TAKE ONE CAPSULE BY MOUTH TWICE A DAY AT BEDTIME 180 capsule 1  . ciclopirox (CICLODAN) 8 % solution Apply topically at bedtime. Apply over nail and surrounding skin. Apply daily over previous coat. After seven (7) days, may remove with alcohol and continue cycle. 6.6 mL 1  . escitalopram (LEXAPRO) 5 MG tablet Take 2 tablets (10 mg total) by mouth daily. 270 tablet 1  . escitalopram (LEXAPRO) 5 MG tablet TAKE  1 TABLET BY MOUTH EVERY DAY 90 tablet 1  . famotidine (PEPCID) 40 MG tablet Take 1 tablet (40 mg total) by mouth every evening. AS NEEDED 90 tablet 1  . fexofenadine (ALLEGRA) 180 MG tablet Take 180 mg by mouth daily.    Marland Kitchen levothyroxine (SYNTHROID) 25 MCG tablet Take 1 tablet (25 mcg total) by mouth daily. 90 tablet 1  . pantoprazole (PROTONIX) 40 MG tablet TAKE 1 TABLET BY MOUTH EVERY DAY 90 tablet 0  . tadalafil (CIALIS) 5 MG tablet Take 1 tablet (5 mg total) by mouth daily as needed for erectile dysfunction. 40 tablet 5   No current facility-administered medications on file prior to visit.     Pulse 77   Ht 6\' 3"  (1.905 m)   Wt 245 lb (111.1 kg)   BMI 30.62 kg/m       Objective:   Physical Exam        Assessment & Plan:

## 2019-06-02 ENCOUNTER — Encounter: Payer: Self-pay | Admitting: *Deleted

## 2019-06-03 ENCOUNTER — Other Ambulatory Visit: Payer: Self-pay | Admitting: Internal Medicine

## 2019-06-13 ENCOUNTER — Other Ambulatory Visit: Payer: Self-pay | Admitting: Family Medicine

## 2019-06-23 ENCOUNTER — Telehealth: Payer: Self-pay | Admitting: *Deleted

## 2019-06-23 NOTE — Telephone Encounter (Signed)
Dr Hilarie Fredrickson has reviewed patient's chart and feels that it is time for patient to repeat cologuard testing at this time. Patient's last cologuard testing for colon cancer screening was completed 08/2015 and was negative. Patient's last office visit was 06/09/18.   I have left a message for patient to call back.

## 2019-06-24 NOTE — Telephone Encounter (Signed)
Patient wife return the call will like to speak to the nurse regarding the recommendations

## 2019-06-24 NOTE — Telephone Encounter (Signed)
Spoke to patients wife Jerrye Beavers. Cologuard has been ordered. Request faxed on 06-24-2019

## 2019-06-24 NOTE — Telephone Encounter (Signed)
Left message to return call 

## 2019-06-29 ENCOUNTER — Other Ambulatory Visit: Payer: Self-pay | Admitting: Family Medicine

## 2019-07-15 ENCOUNTER — Other Ambulatory Visit: Payer: Self-pay

## 2019-07-15 LAB — COLOGUARD: Cologuard: NEGATIVE

## 2019-08-04 ENCOUNTER — Encounter: Payer: Self-pay | Admitting: Family Medicine

## 2019-08-05 MED ORDER — PANTOPRAZOLE SODIUM 40 MG PO TBEC: 40.0000 mg | DELAYED_RELEASE_TABLET | Freq: Every day | ORAL | 3 refills | Status: DC

## 2019-08-12 ENCOUNTER — Other Ambulatory Visit: Payer: Self-pay | Admitting: Medical

## 2019-08-12 MED ORDER — TOBRAMYCIN 0.3 % OP SOLN: 2.0000 [drp] | Freq: Four times a day (QID) | OPHTHALMIC | 0 refills | Status: DC

## 2019-08-13 ENCOUNTER — Other Ambulatory Visit: Payer: Self-pay | Admitting: Family Medicine

## 2019-11-11 ENCOUNTER — Encounter: Payer: Self-pay | Admitting: Family Medicine

## 2019-11-12 ENCOUNTER — Other Ambulatory Visit: Payer: Self-pay | Admitting: Family Medicine

## 2019-11-12 ENCOUNTER — Telehealth: Payer: Self-pay

## 2019-11-12 MED ORDER — ALPRAZOLAM 0.25 MG PO TABS: ORAL_TABLET | ORAL | 0 refills | Status: DC

## 2019-11-12 NOTE — Telephone Encounter (Signed)
Please schedule patient a virtual visit with pcp  

## 2019-11-12 NOTE — Telephone Encounter (Signed)
I have sent in #[phone] but no refills. She needs a follow up for any further refills

## 2019-11-12 NOTE — Telephone Encounter (Signed)
Requesting:xanax Contract:yes UDS:low risk next screen 11/26/2018 Last OV: 05/22/19 Next OV:n/a Last Refill:04/20/19  #[id number] Database:   Please advise

## 2019-11-12 NOTE — Telephone Encounter (Signed)
Was this meant for me?

## 2019-11-13 NOTE — Telephone Encounter (Signed)
Please advise    Sorry dr. Jacinto Reap

## 2019-11-24 ENCOUNTER — Encounter: Payer: Self-pay | Admitting: Family Medicine

## 2019-11-24 ENCOUNTER — Other Ambulatory Visit: Payer: Self-pay

## 2019-11-24 ENCOUNTER — Ambulatory Visit (INDEPENDENT_AMBULATORY_CARE_PROVIDER_SITE_OTHER): Payer: Medicare Other | Admitting: Family Medicine

## 2019-11-24 DIAGNOSIS — R739 Hyperglycemia, unspecified: Secondary | ICD-10-CM | POA: Diagnosis not present

## 2019-11-24 DIAGNOSIS — E039 Hypothyroidism, unspecified: Secondary | ICD-10-CM | POA: Diagnosis not present

## 2019-11-24 DIAGNOSIS — E7800 Pure hypercholesterolemia, unspecified: Secondary | ICD-10-CM

## 2019-11-24 DIAGNOSIS — E669 Obesity, unspecified: Secondary | ICD-10-CM

## 2019-11-24 DIAGNOSIS — F418 Other specified anxiety disorders: Secondary | ICD-10-CM

## 2019-11-24 MED ORDER — ESCITALOPRAM OXALATE 5 MG PO TABS: 5.0000 mg | ORAL_TABLET | Freq: Every day | ORAL | 1 refills | Status: DC

## 2019-11-24 NOTE — Assessment & Plan Note (Signed)
hgba1c acceptable, minimize simple carbs. Increase exercise as tolerated.  

## 2019-11-24 NOTE — Assessment & Plan Note (Signed)
Maintain heart healthy diet, decrease po intake and increase exercise as tolerated. Needs 7-8 hours of sleep nightly. Avoid trans fats, eat small, frequent meals every 4-5 hours with lean proteins, complex carbs and healthy fats. Minimize simple carbs

## 2019-11-24 NOTE — Assessment & Plan Note (Signed)
On Levothyroxine, continue to monitor 

## 2019-11-24 NOTE — Assessment & Plan Note (Addendum)
Doing very well on Escitalopram 5 mg daily and has used 10 mg during a rough patch as well. Need Alprazolam once to twice a week for sleep and it works well. No changes today, counseled for 20 minutes of a 25 minute visit.

## 2019-11-24 NOTE — Progress Notes (Signed)
Subjective:    Patient ID: Gerald Booth, male    DOB: July 14, 1956, 63 y.o.   MRN: [id number]  No chief complaint on file.   HPI Patient is in today for follow up on chronic medical concerns. No recent febrile illness or hospitalizations, she is maintaining quarantine well and trying to eat a heart healthy diet. Denies CP/palp/SOB/HA/congestion/fevers/GI or GU c/o. Taking meds as prescribed  Past Medical History:  Diagnosis Date  . Anxiety and depression 01/27/2013  . Chicken pox as a child  . Depression with anxiety 01/27/2013  . ED (erectile dysfunction) 10/10/2012  . Elevated BP 10/03/2012  . GERD (gastroesophageal reflux disease)   . Hearing loss 10/10/2012  . 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 Hypothyroid 03/31/2017  . Kidney stones 10/10/2012  . Measles as a child  . Mumps as a child  . Obese 10/03/2012  . Onychomycosis 10/10/2012  . Preventative health care 10/10/2012  . Skin cancer of face 04/06/2016    Past Surgical History:  Procedure Laterality Date  . WISDOM TOOTH EXTRACTION  03-10-11   1 tooth    Family History  Problem Relation Age of Onset  . Cancer Mother        breast, bone  . Hyperlipidemia Father   . Other Father        prostate surgery  . Dementia Father   . Heart attack Maternal Grandmother   . Kidney disease Maternal Grandfather        kidney failure  . Cancer Sister        metastatic lung cancer  . AAA (abdominal aortic aneurysm) Brother     Social History   Socioeconomic History  . Marital status: Married    Spouse name: Not on file  . Number of children: 1  . Years of education: Not on file  . Highest education level: Not on file  Occupational History  . Occupation: Retired  Tobacco Use  . Smoking status: Former Smoker    Types: Cigarettes    Start date: 12/11/1991  . Smokeless tobacco: Never Used  . Tobacco comment: smoked on weekends  Substance and Sexual Activity  . Alcohol use: Yes    Comment: 1 a day  . Drug use: No  . Sexual  activity: Yes    Comment: no dietary changes, lives with wife  Other Topics Concern  . Not on file  Social History Narrative   Makes plates for a living.   Lives at home with wife         Brother passed away 03/09/2016 from Aorta aneurism    Social Determinants of Health   Financial Resource Strain:   . Difficulty of Paying Living Expenses: Not on file  Food Insecurity:   . Worried About Charity fundraiser in the Last Year: Not on file  . Ran Out of Food in the Last Year: Not on file  Transportation Needs:   . Lack of Transportation (Medical): Not on file  . Lack of Transportation (Non-Medical): Not on file  Physical Activity:   . Days of Exercise per Week: Not on file  . Minutes of Exercise per Session: Not on file  Stress:   . Feeling of Stress : Not on file  Social Connections:   . Frequency of Communication with Friends and Family: Not on file  . Frequency of Social Gatherings with Friends and Family: Not on file  . Attends Religious Services: Not on file  . Active Member of Clubs or Organizations:  Not on file  . Attends Archivist Meetings: Not on file  . Marital Status: Not on file  Intimate Partner Violence:   . Fear of Current or Ex-Partner: Not on file  . Emotionally Abused: Not on file  . Physically Abused: Not on file  . Sexually Abused: Not on file    Outpatient Medications Prior to Visit  Medication Sig Dispense Refill  . acyclovir (ZOVIRAX) 800 MG tablet TAKE 1 TABLET (800 MG TOTAL) BY MOUTH 3 (THREE) TIMES DAILY AS NEEDED. 270 tablet 1  . ALPRAZolam (XANAX) 0.25 MG tablet TAKE 1 TABLET BY MOUTH TWICE A DAY AS NEEDED FOR ANXIETY 45 tablet 0  . aspirin EC 81 MG tablet Take 1 tablet (81 mg total) by mouth daily.    . celecoxib (CELEBREX) 200 MG capsule TAKE ONE CAPSULE BY MOUTH TWICE A DAY AT BEDTIME 180 capsule 1  . ciclopirox (CICLODAN) 8 % solution Apply topically at bedtime. Apply over nail and surrounding skin. Apply daily over previous coat. After  seven (7) days, may remove with alcohol and continue cycle. 6.6 mL 1  . famotidine (PEPCID) 40 MG tablet Take 1 tablet (40 mg total) by mouth every evening. AS NEEDED 90 tablet 1  . fexofenadine (ALLEGRA) 180 MG tablet Take 180 mg by mouth daily.    Marland Kitchen levothyroxine (SYNTHROID) 25 MCG tablet TAKE 1 TABLET BY MOUTH EVERY DAY 90 tablet 1  . pantoprazole (PROTONIX) 40 MG tablet Take 1 tablet (40 mg total) by mouth daily. 90 tablet 3  . tadalafil (CIALIS) 5 MG tablet Take 1 tablet (5 mg total) by mouth daily as needed for erectile dysfunction. 40 tablet 5  . tobramycin (TOBREX) 0.3 % ophthalmic solution Place 2 drops into the left eye every 6 (six) hours. 5 mL 0  . doxycycline (VIBRA-TABS) 100 MG tablet Take 1 tablet (100 mg total) by mouth 2 (two) times daily. Can give caps or generic 14 tablet 0  . escitalopram (LEXAPRO) 5 MG tablet Take 2 tablets (10 mg total) by mouth daily. 270 tablet 1  . escitalopram (LEXAPRO) 5 MG tablet TAKE 1 TABLET BY MOUTH EVERY DAY 90 tablet 1   No facility-administered medications prior to visit.    Allergies  Allergen Reactions  . Penicillins     Nose bleeds    Review of Systems  Constitutional: Negative for fever and malaise/fatigue.  HENT: Negative for congestion.   Eyes: Negative for blurred vision.  Respiratory: Negative for shortness of breath.   Cardiovascular: Negative for chest pain, palpitations and leg swelling.  Gastrointestinal: Negative for abdominal pain, blood in stool and nausea.  Genitourinary: Negative for dysuria and frequency.  Musculoskeletal: Negative for falls.  Skin: Negative for rash.  Neurological: Negative for dizziness, loss of consciousness and headaches.  Endo/Heme/Allergies: Negative for environmental allergies.  Psychiatric/Behavioral: Negative for depression. The patient is not nervous/anxious.        Objective:    Physical Exam Constitutional:      Appearance: Normal appearance. He is not ill-appearing.  HENT:      Head: Normocephalic and atraumatic.     Nose: Nose normal.  Eyes:     General:        Right eye: No discharge.        Left eye: No discharge.  Pulmonary:     Effort: Pulmonary effort is normal.  Neurological:     Mental Status: He is alert and oriented to person, place, and time.  Psychiatric:  Mood and Affect: Mood normal.        Behavior: Behavior normal.     BP 125/85 (BP Location: Left Arm, Patient Position: Sitting, Cuff Size: Normal)   Temp 98 F (36.7 C) (Oral)   Wt 250 lb (113.4 kg)   SpO2 97%   BMI 31.25 kg/m  Wt Readings from Last 3 Encounters:  11/24/19 250 lb (113.4 kg)  05/22/19 245 lb (111.1 kg)  04/20/19 245 lb (111.1 kg)    Diabetic Foot Exam - Simple   No data filed     Lab Results  Component Value Date   WBC 5.6 05/27/2018   HGB 15.9 05/27/2018   HCT 44.8 05/27/2018   PLT 179.0 05/27/2018   GLUCOSE 98 05/27/2018   CHOL 244 (H) 05/27/2018   TRIG 242.0 (H) 05/27/2018   HDL 49.00 05/27/2018   LDLDIRECT 156.0 05/27/2018   LDLCALC 114 (H) 09/24/2017   ALT 17 05/27/2018   AST 17 05/27/2018   NA 140 05/27/2018   K 3.9 05/27/2018   CL 104 05/27/2018   CREATININE 1.04 05/27/2018   BUN 12 05/27/2018   CO2 28 05/27/2018   TSH 2.69 05/27/2018   PSA 0.79 02/29/2016   HGBA1C 5.3 05/27/2018    Lab Results  Component Value Date   TSH 2.69 05/27/2018   Lab Results  Component Value Date   WBC 5.6 05/27/2018   HGB 15.9 05/27/2018   HCT 44.8 05/27/2018   MCV 92.2 05/27/2018   PLT 179.0 05/27/2018   Lab Results  Component Value Date   NA 140 05/27/2018   K 3.9 05/27/2018   CO2 28 05/27/2018   GLUCOSE 98 05/27/2018   BUN 12 05/27/2018   CREATININE 1.04 05/27/2018   BILITOT 0.9 05/27/2018   ALKPHOS 70 05/27/2018   AST 17 05/27/2018   ALT 17 05/27/2018   PROT 7.1 05/27/2018   ALBUMIN 4.5 05/27/2018   CALCIUM 9.7 05/27/2018   GFR 76.39 05/27/2018   Lab Results  Component Value Date   CHOL 244 (H) 05/27/2018   Lab Results    Component Value Date   HDL 49.00 05/27/2018   Lab Results  Component Value Date   LDLCALC 114 (H) 09/24/2017   Lab Results  Component Value Date   TRIG 242.0 (H) 05/27/2018   Lab Results  Component Value Date   CHOLHDL 5 05/27/2018   Lab Results  Component Value Date   HGBA1C 5.3 05/27/2018       Assessment & Plan:   Problem List Items Addressed This Visit    HYPERCHOLESTEROLEMIA    Encouraged heart healthy diet, increase exercise, avoid trans fats, consider a krill oil cap daily      Obese    Maintain heart healthy diet, decrease po intake and increase exercise as tolerated. Needs 7-8 hours of sleep nightly. Avoid trans fats, eat small, frequent meals every 4-5 hours with lean proteins, complex carbs and healthy fats. Minimize simple carbs      Depression with anxiety    Doing very well on Escitalopram 5 mg daily and has used 10 mg during a rough patch as well. Need Alprazolam once to twice a week for sleep and it works well. No changes today, counseled for 20 minutes of a 25 minute visit.      Relevant Medications   escitalopram (LEXAPRO) 5 MG tablet   Hyperglycemia    hgba1c acceptable, minimize simple carbs. Increase exercise as tolerated.       Hypothyroid  On Levothyroxine, continue to monitor         I have discontinued Yeng Morataya. Like's escitalopram and doxycycline. I have also changed his escitalopram. Additionally, I am having him maintain his aspirin EC, fexofenadine, famotidine, tadalafil, celecoxib, ciclopirox, acyclovir, pantoprazole, tobramycin, levothyroxine, and ALPRAZolam.  Meds ordered this encounter  Medications  . escitalopram (LEXAPRO) 5 MG tablet    Sig: Take 1-2 tablets (5-10 mg total) by mouth daily.    Dispense:  270 tablet    Refill:  1     Penni Homans, MD

## 2019-11-24 NOTE — Assessment & Plan Note (Signed)
Encouraged heart healthy diet, increase exercise, avoid trans fats, consider a krill oil cap daily 

## 2020-01-24 ENCOUNTER — Ambulatory Visit: Payer: Medicare Other

## 2020-02-03 ENCOUNTER — Other Ambulatory Visit: Payer: Self-pay | Admitting: Family Medicine

## 2020-03-08 ENCOUNTER — Other Ambulatory Visit: Payer: Self-pay | Admitting: Family Medicine

## 2020-03-08 MED ORDER — ALPRAZOLAM 0.25 MG PO TABS: ORAL_TABLET | ORAL | 0 refills | Status: DC

## 2020-07-29 ENCOUNTER — Encounter: Payer: Self-pay | Admitting: Family Medicine

## 2020-11-15 ENCOUNTER — Other Ambulatory Visit: Payer: Self-pay | Admitting: Family Medicine

## 2020-12-12 ENCOUNTER — Telehealth (INDEPENDENT_AMBULATORY_CARE_PROVIDER_SITE_OTHER): Payer: Medicare Other | Admitting: Family Medicine

## 2020-12-12 ENCOUNTER — Encounter: Payer: Self-pay | Admitting: Family Medicine

## 2020-12-12 ENCOUNTER — Other Ambulatory Visit: Payer: Self-pay

## 2020-12-12 ENCOUNTER — Other Ambulatory Visit: Payer: Self-pay | Admitting: Family Medicine

## 2020-12-12 VITALS — BP 121/80 | HR 80 | Temp 97.7°F | Wt 253.0 lb

## 2020-12-12 DIAGNOSIS — R739 Hyperglycemia, unspecified: Secondary | ICD-10-CM

## 2020-12-12 DIAGNOSIS — E7800 Pure hypercholesterolemia, unspecified: Secondary | ICD-10-CM | POA: Diagnosis not present

## 2020-12-12 DIAGNOSIS — F418 Other specified anxiety disorders: Secondary | ICD-10-CM

## 2020-12-12 DIAGNOSIS — E039 Hypothyroidism, unspecified: Secondary | ICD-10-CM

## 2020-12-12 DIAGNOSIS — R030 Elevated blood-pressure reading, without diagnosis of hypertension: Secondary | ICD-10-CM | POA: Diagnosis not present

## 2020-12-12 NOTE — Assessment & Plan Note (Signed)
On Levothyroxine, continue to monitor 

## 2020-12-12 NOTE — Assessment & Plan Note (Signed)
Despite the stress of the pandemic, he is doing well on current medications. No changes to medications at this time

## 2020-12-12 NOTE — Progress Notes (Signed)
Virtual Visit via phone Note  I connected with Gerald Booth on 12/12/20 at  2:20 PM EST by a phone enabled telemedicine application and verified that I am speaking with the correct person using two identifiers.  Location: Patient: home, provider and patient in visit Provider: office   I discussed the limitations of evaluation and management by telemedicine and the availability of in person appointments. The patient expressed understanding and agreed to proceed. Kelle Darting, CMA was able to get the patient setup on a phone visit after being unable to set up a video visit.    Subjective:    Patient ID: Gerald Booth, male    DOB: 1956/07/02, 65 y.o.   MRN: [id number]  Chief Complaint  Patient presents with  . Follow-up    Discuss, pt states everything is good,    HPI Patient is in today for follow up on chronic medical concerns. No recent febrile illness or hospitalizations. He is maintaining quarantine well. He tries to stay active and maintain a heart healthy diet. No polyuria orpolydipsia noted. Denies CP/palp/SOB/HA/congestion/fevers/GI or GU c/o. Taking meds as prescribed  Past Medical History:  Diagnosis Date  . Anxiety and depression 01/27/2013  . Chicken pox as a child  . Depression with anxiety 01/27/2013  . ED (erectile dysfunction) 10/10/2012  . Elevated BP 10/03/2012  . GERD (gastroesophageal reflux disease)   . Hearing loss 10/10/2012  . 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 Hypothyroid 03/31/2017  . Kidney stones 10/10/2012  . Measles as a child  . Mumps as a child  . Obese 10/03/2012  . Onychomycosis 10/10/2012  . Preventative health care 10/10/2012  . Skin cancer of face 04/06/2016    Past Surgical History:  Procedure Laterality Date  . WISDOM TOOTH EXTRACTION  04/02/11   1 tooth    Family History  Problem Relation Age of Onset  . Cancer Mother        breast, bone  . Hyperlipidemia Father   . Other Father        prostate surgery  . Dementia Father    . Heart attack Maternal Grandmother   . Kidney disease Maternal Grandfather        kidney failure  . Cancer Sister        metastatic lung cancer  . AAA (abdominal aortic aneurysm) Brother     Social History   Socioeconomic History  . Marital status: Married    Spouse name: Not on file  . Number of children: 1  . Years of education: Not on file  . Highest education level: Not on file  Occupational History  . Occupation: Retired  Tobacco Use  . Smoking status: Former Smoker    Types: Cigarettes    Start date: 12/11/1991  . Smokeless tobacco: Never Used  . Tobacco comment: smoked on weekends  Vaping Use  . Vaping Use: Never used  Substance and Sexual Activity  . Alcohol use: Yes    Comment: 1 a day  . Drug use: No  . Sexual activity: Yes    Comment: no dietary changes, lives with wife  Other Topics Concern  . Not on file  Social History Narrative   Makes plates for a living.   Lives at home with wife         Brother passed away 04-01-16 from Aorta aneurism    Social Determinants of Health   Financial Resource Strain: Not on file  Food Insecurity: Not on file  Transportation Needs: Not on file  Physical Activity: Not on file  Stress: Not on file  Social Connections: Not on file  Intimate Partner Violence: Not on file    Outpatient Medications Prior to Visit  Medication Sig Dispense Refill  . acyclovir (ZOVIRAX) 800 MG tablet TAKE 1 TABLET (800 MG TOTAL) BY MOUTH 3 (THREE) TIMES DAILY AS NEEDED. 270 tablet 1  . ALPRAZolam (XANAX) 0.25 MG tablet TAKE 1 TABLET BY MOUTH TWICE A DAY AS NEEDED FOR ANXIETY 45 tablet 0  . aspirin EC 81 MG tablet Take 1 tablet (81 mg total) by mouth daily.    . celecoxib (CELEBREX) 200 MG capsule TAKE ONE CAPSULE BY MOUTH TWICE A DAY AT BEDTIME 180 capsule 1  . escitalopram (LEXAPRO) 5 MG tablet Take 2 tablets (10 mg total) by mouth daily. 180 tablet 0  . fexofenadine (ALLEGRA) 180 MG tablet Take 180 mg by mouth daily.    Marland Kitchen levothyroxine  (SYNTHROID) 25 MCG tablet TAKE 1 TABLET BY MOUTH EVERY DAY 90 tablet 1  . pantoprazole (PROTONIX) 40 MG tablet Take 1 tablet (40 mg total) by mouth daily. 90 tablet 3  . tadalafil (CIALIS) 5 MG tablet Take 1 tablet (5 mg total) by mouth daily as needed for erectile dysfunction. 40 tablet 5  . tobramycin (TOBREX) 0.3 % ophthalmic solution Place 2 drops into the left eye every 6 (six) hours. 5 mL 0  . ciclopirox (CICLODAN) 8 % solution Apply topically at bedtime. Apply over nail and surrounding skin. Apply daily over previous coat. After seven (7) days, may remove with alcohol and continue cycle. (Patient not taking: Reported on 12/12/2020) 6.6 mL 1  . famotidine (PEPCID) 40 MG tablet Take 1 tablet (40 mg total) by mouth every evening. AS NEEDED (Patient not taking: Reported on 12/12/2020) 90 tablet 1   No facility-administered medications prior to visit.    Allergies  Allergen Reactions  . Penicillins     Nose bleeds    Review of Systems  Constitutional: Negative for fever and malaise/fatigue.  HENT: Negative for congestion.   Eyes: Negative for blurred vision.  Respiratory: Negative for shortness of breath.   Cardiovascular: Negative for chest pain, palpitations and leg swelling.  Gastrointestinal: Negative for abdominal pain, blood in stool and nausea.  Genitourinary: Negative for dysuria and frequency.  Musculoskeletal: Negative for falls.  Skin: Negative for rash.  Neurological: Negative for dizziness, loss of consciousness and headaches.  Endo/Heme/Allergies: Negative for environmental allergies.  Psychiatric/Behavioral: Negative for depression. The patient is not nervous/anxious.        Objective:    Physical Exam  Unable to obtain via phone visit BP 121/80   Pulse 80   Temp 97.7 F (36.5 C)   Wt 253 lb (114.8 kg)   BMI 31.62 kg/m  Wt Readings from Last 3 Encounters:  12/12/20 253 lb (114.8 kg)  11/24/19 250 lb (113.4 kg)  05/22/19 245 lb (111.1 kg)    Diabetic Foot  Exam - Simple   No data filed    Lab Results  Component Value Date   WBC 5.6 05/27/2018   HGB 15.9 05/27/2018   HCT 44.8 05/27/2018   PLT 179.0 05/27/2018   GLUCOSE 98 05/27/2018   CHOL 244 (H) 05/27/2018   TRIG 242.0 (H) 05/27/2018   HDL 49.00 05/27/2018   LDLDIRECT 156.0 05/27/2018   LDLCALC 114 (H) 09/24/2017   ALT 17 05/27/2018   AST 17 05/27/2018   NA 140 05/27/2018   K 3.9 05/27/2018   CL 104 05/27/2018  CREATININE 1.04 05/27/2018   BUN 12 05/27/2018   CO2 28 05/27/2018   TSH 2.69 05/27/2018   PSA 0.79 02/29/2016   HGBA1C 5.3 05/27/2018    Lab Results  Component Value Date   TSH 2.69 05/27/2018   Lab Results  Component Value Date   WBC 5.6 05/27/2018   HGB 15.9 05/27/2018   HCT 44.8 05/27/2018   MCV 92.2 05/27/2018   PLT 179.0 05/27/2018   Lab Results  Component Value Date   NA 140 05/27/2018   K 3.9 05/27/2018   CO2 28 05/27/2018   GLUCOSE 98 05/27/2018   BUN 12 05/27/2018   CREATININE 1.04 05/27/2018   BILITOT 0.9 05/27/2018   ALKPHOS 70 05/27/2018   AST 17 05/27/2018   ALT 17 05/27/2018   PROT 7.1 05/27/2018   ALBUMIN 4.5 05/27/2018   CALCIUM 9.7 05/27/2018   GFR 76.39 05/27/2018   Lab Results  Component Value Date   CHOL 244 (H) 05/27/2018   Lab Results  Component Value Date   HDL 49.00 05/27/2018   Lab Results  Component Value Date   LDLCALC 114 (H) 09/24/2017   Lab Results  Component Value Date   TRIG 242.0 (H) 05/27/2018   Lab Results  Component Value Date   CHOLHDL 5 05/27/2018   Lab Results  Component Value Date   HGBA1C 5.3 05/27/2018       Assessment & Plan:   Problem List Items Addressed This Visit    HYPERCHOLESTEROLEMIA - Primary    Encouraged heart healthy diet, increase exercise, avoid trans fats, consider a krill oil cap daily      Relevant Orders   Lipid panel   Elevated blood pressure, situational   Depression with anxiety    Despite the stress of the pandemic, he is doing well on current  medications. No changes to medications at this time      Hyperglycemia    hgba1c acceptable, minimize simple carbs. Increase exercise as tolerated.       Relevant Orders   Hemoglobin A1c   CBC   TSH   Hypothyroid    On Levothyroxine, continue to monitor      Relevant Orders   TSH      I am having Barth Kirks D. Neidhart maintain his aspirin EC, fexofenadine, famotidine, tadalafil, celecoxib, ciclopirox, acyclovir, pantoprazole, tobramycin, levothyroxine, ALPRAZolam, and escitalopram.  No orders of the defined types were placed in this encounter.    I discussed the assessment and treatment plan with the patient. The patient was provided an opportunity to ask questions and all were answered. The patient agreed with the plan and demonstrated an understanding of the instructions.   The patient was advised to call back or seek an in-person evaluation if the symptoms worsen or if the condition fails to improve as anticipated.  I provided 25 minutes of non-face-to-face time during this encounter.   Penni Homans, MD

## 2020-12-12 NOTE — Assessment & Plan Note (Signed)
hgba1c acceptable, minimize simple carbs. Increase exercise as tolerated.  

## 2020-12-12 NOTE — Assessment & Plan Note (Signed)
Encouraged heart healthy diet, increase exercise, avoid trans fats, consider a krill oil cap daily 

## 2020-12-26 ENCOUNTER — Other Ambulatory Visit: Payer: Medicare Other

## 2021-02-06 ENCOUNTER — Other Ambulatory Visit: Payer: Self-pay | Admitting: Family Medicine

## 2021-02-06 NOTE — Telephone Encounter (Signed)
Requesting: generic lexapro 5 mg   Contract: not on file  UDS: 05/27/18 Last Visit: 12/12/20 video visit  Next Visit: n/a Last Refill: 03/08/20  Please Advise

## 2021-04-25 ENCOUNTER — Encounter: Payer: Medicare Other | Admitting: Family Medicine

## 2021-05-06 ENCOUNTER — Other Ambulatory Visit: Payer: Self-pay | Admitting: Family Medicine

## 2021-06-15 ENCOUNTER — Ambulatory Visit (INDEPENDENT_AMBULATORY_CARE_PROVIDER_SITE_OTHER): Payer: Medicare Other | Admitting: Family Medicine

## 2021-06-15 ENCOUNTER — Encounter: Payer: Self-pay | Admitting: Family Medicine

## 2021-06-15 ENCOUNTER — Encounter: Payer: Self-pay | Admitting: *Deleted

## 2021-06-15 ENCOUNTER — Other Ambulatory Visit: Payer: Self-pay

## 2021-06-15 VITALS — BP 126/78 | HR 93 | Temp 98.3°F | Resp 16 | Ht 75.0 in | Wt 263.4 lb

## 2021-06-15 DIAGNOSIS — R351 Nocturia: Secondary | ICD-10-CM

## 2021-06-15 DIAGNOSIS — E7800 Pure hypercholesterolemia, unspecified: Secondary | ICD-10-CM | POA: Diagnosis not present

## 2021-06-15 DIAGNOSIS — Z0000 Encounter for general adult medical examination without abnormal findings: Secondary | ICD-10-CM | POA: Diagnosis not present

## 2021-06-15 DIAGNOSIS — Z79899 Other long term (current) drug therapy: Secondary | ICD-10-CM

## 2021-06-15 DIAGNOSIS — F418 Other specified anxiety disorders: Secondary | ICD-10-CM

## 2021-06-15 DIAGNOSIS — R739 Hyperglycemia, unspecified: Secondary | ICD-10-CM

## 2021-06-15 DIAGNOSIS — E669 Obesity, unspecified: Secondary | ICD-10-CM

## 2021-06-15 DIAGNOSIS — K219 Gastro-esophageal reflux disease without esophagitis: Secondary | ICD-10-CM

## 2021-06-15 DIAGNOSIS — E039 Hypothyroidism, unspecified: Secondary | ICD-10-CM

## 2021-06-15 DIAGNOSIS — Z23 Encounter for immunization: Secondary | ICD-10-CM | POA: Diagnosis not present

## 2021-06-15 LAB — COMPREHENSIVE METABOLIC PANEL
ALT: 37 U/L (ref 0–53)
AST: 27 U/L (ref 0–37)
Albumin: 4.8 g/dL (ref 3.5–5.2)
Alkaline Phosphatase: 77 U/L (ref 39–117)
BUN: 16 mg/dL (ref 6–23)
CO2: 25 mEq/L (ref 19–32)
Calcium: 9.8 mg/dL (ref 8.4–10.5)
Chloride: 102 mEq/L (ref 96–112)
Creatinine, Ser: 1.15 mg/dL (ref 0.40–1.50)
GFR: 66.02 mL/min (ref >60.00)
Glucose, Bld: 105 mg/dL — ABNORMAL HIGH (ref 70–99)
Potassium: 3.9 mEq/L (ref 3.5–5.1)
Sodium: 138 mEq/L (ref 135–145)
Total Bilirubin: 1.4 mg/dL — ABNORMAL HIGH (ref 0.2–1.2)
Total Protein: 7.5 g/dL (ref 6.0–8.3)

## 2021-06-15 LAB — CBC
HCT: 47.5 % (ref 39.0–52.0)
Hemoglobin: 16.5 g/dL (ref 13.0–17.0)
MCHC: 34.8 g/dL (ref 30.0–36.0)
MCV: 93.4 fl (ref 78.0–100.0)
Platelets: 181 10*3/uL (ref 150.0–400.0)
RBC: 5.09 Mil/uL (ref 4.22–5.81)
RDW: 13.4 % (ref 11.5–15.5)
WBC: 6.4 10*3/uL (ref 4.0–10.5)

## 2021-06-15 LAB — LIPID PANEL
Cholesterol: 262 mg/dL — ABNORMAL HIGH (ref 0–200)
HDL: 44.3 mg/dL (ref >39.00)
NonHDL: 217.67
Total CHOL/HDL Ratio: 6
Triglycerides: 306 mg/dL — ABNORMAL HIGH (ref 0.0–149.0)
VLDL: 61.2 mg/dL — ABNORMAL HIGH (ref 0.0–40.0)

## 2021-06-15 LAB — PSA: PSA: 0.92 ng/mL (ref 0.10–4.00)

## 2021-06-15 LAB — HEMOGLOBIN A1C: Hgb A1c MFr Bld: 5.4 % (ref 4.6–6.5)

## 2021-06-15 LAB — LDL CHOLESTEROL, DIRECT: Direct LDL: 182 mg/dL

## 2021-06-15 LAB — TSH: TSH: 4.34 u[IU]/mL (ref 0.35–5.50)

## 2021-06-15 NOTE — Assessment & Plan Note (Signed)
hgba1c acceptable, minimize simple carbs. Increase exercise as tolerated.  

## 2021-06-15 NOTE — Assessment & Plan Note (Signed)
Avoid offending foods, start probiotics. Do not eat large meals in late evening and consider raising head of bed. Doing well on Famotidine

## 2021-06-15 NOTE — Assessment & Plan Note (Signed)
Encourage heart healthy diet such as MIND or DASH diet, increase exercise, avoid trans fats, simple carbohydrates and processed foods, consider a krill or fish or flaxseed oil cap daily.  °

## 2021-06-15 NOTE — Progress Notes (Signed)
Patient ID: Gerald Booth, male    DOB: 1956/09/19  Age: 65 y.o. MRN: [id number]    Subjective:  Subjective  HPI Gerald Booth presents for office visit today for comprehensive physical exam today and follow up on management of chronic concerns. He states that he has no recent hospitalizations or ER visits to report. He denies CP/palp/SOB/HA/congestion/fevers/GI or GU c/o. Taking meds as prescribed. He states that his depression has improved significantly when he started Lexapro 5 MG. He states that his acid reflux is doing better now that he takes Pepcid at night instead of pantoprazole 40 MG.   Review of Systems  Constitutional:  Negative for chills, fatigue and fever.  HENT:  Negative for congestion, rhinorrhea, sinus pressure, sinus pain and sore throat.   Eyes:  Negative for pain.  Respiratory:  Negative for cough and shortness of breath.   Cardiovascular:  Negative for chest pain, palpitations and leg swelling.  Gastrointestinal:  Negative for abdominal pain, blood in stool, diarrhea, nausea and vomiting.  Genitourinary:  Negative for flank pain, frequency and penile pain.  Musculoskeletal:  Negative for back pain.  Neurological:  Negative for headaches.   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   His family history includes AAA (abdominal aortic aneurysm) in his brother; COPD in his brother; Cancer in his mother and  sister; Dementia in his father; Heart attack in his maternal grandmother; Hyperlipidemia in his father; Kidney disease in his maternal grandfather; Other in his father.He reports that he has quit smoking. His smoking use included cigarettes. He started smoking about 29 years ago. He has never used smokeless tobacco. He reports current alcohol use. He reports that he does not use drugs.  Current Outpatient Medications on File Prior to Visit  Medication Sig Dispense Refill   acyclovir (ZOVIRAX) 800 MG tablet TAKE 1 TABLET (800 MG TOTAL) BY MOUTH 3 (THREE) TIMES DAILY AS NEEDED. 270 tablet 1   ALPRAZolam (XANAX) 0.25 MG tablet TAKE 1 TABLET BY MOUTH TWICE A DAY AS NEEDED FOR ANXIETY 45 tablet 0   aspirin EC 81 MG tablet Take 1 tablet (81 mg total) by mouth daily.     celecoxib (CELEBREX) 200 MG capsule TAKE ONE CAPSULE BY MOUTH TWICE A DAY AT BEDTIME 180 capsule 1   ciclopirox (CICLODAN) 8 % solution Apply topically at bedtime. Apply over nail and surrounding skin. Apply daily over previous coat. After seven (7) days, may remove with alcohol and continue cycle. 6.6 mL 1   escitalopram (LEXAPRO) 5 MG tablet TAKE 2 TABLETS BY MOUTH EVERY DAY 180 tablet 0   famotidine (PEPCID) 40 MG tablet TAKE 1 TABLET (40 MG TOTAL) BY MOUTH EVERY EVENING. AS NEEDED 90 tablet 1   fexofenadine (ALLEGRA) 180 MG tablet Take 180 mg by mouth daily.     levothyroxine (SYNTHROID) 25 MCG tablet TAKE 1 TABLET BY MOUTH EVERY DAY 90  tablet 1   tadalafil (CIALIS) 5 MG tablet Take 1 tablet (5 mg total) by mouth daily as needed for erectile dysfunction. 40 tablet 5   No current facility-administered medications on file prior to visit.     Objective:  Objective  Physical Exam Constitutional:      General: He is not in acute distress.    Appearance: Normal appearance. He is not ill-appearing or toxic-appearing.  HENT:     Head: Normocephalic and atraumatic.     Right Ear: Tympanic membrane, ear canal and external ear normal.      Left Ear: Tympanic membrane, ear canal and external ear normal.     Nose: No congestion or rhinorrhea.  Eyes:     General: No visual field deficit.    Extraocular Movements: Extraocular movements intact.     Right eye: No nystagmus.     Left eye: No nystagmus.     Pupils: Pupils are equal, round, and reactive to light.  Cardiovascular:     Rate and Rhythm: Normal rate and regular rhythm.     Pulses: Normal pulses.     Heart sounds: Normal heart sounds. No murmur heard. Pulmonary:     Effort: Pulmonary effort is normal. No respiratory distress.     Breath sounds: Normal breath sounds. No wheezing, rhonchi or rales.  Abdominal:     General: Bowel sounds are normal.     Palpations: Abdomen is soft. There is no mass.     Tenderness: no abdominal tenderness There is no guarding.     Hernia: No hernia is present.  Musculoskeletal:        General: Normal range of motion.     Cervical back: Normal range of motion and neck supple.  Skin:    General: Skin is warm and dry.  Neurological:     Mental Status: He is alert and oriented to person, place, and time.     Cranial Nerves: No facial asymmetry.     Motor: Motor function is intact. No weakness or tremor.  Psychiatric:        Behavior: Behavior normal.   BP 126/78   Pulse 93   Temp 98.3 F (36.8 C)   Resp 16   Ht 6\' 3"  (1.905 m)   Wt 263 lb 6.4 oz (119.5 kg)   SpO2 97%   BMI 32.92 kg/m  Wt Readings from Last 3 Encounters:  06/15/21 263 lb 6.4 oz (119.5 kg)  12/12/20 253 lb (114.8 kg)  11/24/19 250 lb (113.4 kg)     Lab Results  Component Value Date   WBC 6.4 06/15/2021   HGB 16.5 06/15/2021   HCT 47.5 06/15/2021   PLT 181.0 06/15/2021   GLUCOSE 105 (H) 06/15/2021   CHOL 262 (H) 06/15/2021   TRIG 306.0 (H) 06/15/2021   HDL 44.30 06/15/2021   LDLDIRECT 182.0 06/15/2021   LDLCALC 114 (H) 09/24/2017   ALT 37 06/15/2021   AST 27 06/15/2021   NA 138 06/15/2021   K 3.9 06/15/2021   CL 102 06/15/2021   CREATININE  1.15 06/15/2021   BUN 16 06/15/2021   CO2 25 06/15/2021   TSH 4.34 06/15/2021   PSA 0.92 06/15/2021   HGBA1C 5.4 06/15/2021    CT RENAL STONE STUDY  Result Date: 11/14/2015 CLINICAL DATA:  65 year old male with history of right-sided flank pain for 1 week. Nausea for the first 3 days of this pain, which has now subsided. Remote history of kidney stone. EXAM: CT ABDOMEN AND  PELVIS WITHOUT CONTRAST TECHNIQUE: Multidetector CT imaging of the abdomen and pelvis was performed following the standard protocol without IV contrast. COMPARISON:  No priors. FINDINGS: Lower chest:  Unremarkable. Hepatobiliary: No cystic or solid hepatic lesions identified on today's noncontrast CT examination. The unenhanced appearance of the gallbladder is normal. Pancreas: Small calcification in the head of the pancreas incidentally noted. No definite pancreatic mass or peripancreatic inflammatory changes on today's noncontrast CT. Spleen: Unremarkable. Adrenals/Urinary Tract: There are no abnormal calcifications within the collecting system of either kidney, along the course of either ureter, or within the lumen of the urinary bladder. No hydroureteronephrosis or perinephric stranding to suggest urinary tract obstruction at this time. The unenhanced appearance of the kidneys is unremarkable bilaterally. Unenhanced appearance of the urinary bladder is normal. Bilateral adrenal glands are normal in appearance. Stomach/Bowel: Unenhanced appearance of the stomach is normal. No pathologic dilatation of small bowel or colon. Normal appendix. Vascular/Lymphatic: Mild atherosclerotic calcifications noted throughout the abdominal and pelvic vasculature, without evidence of aneurysm. No lymphadenopathy noted in the abdomen or pelvis on today's noncontrast CT examination. Reproductive: Prostate gland and seminal vesicles are unremarkable in appearance. Other: No significant volume of ascites.  No pneumoperitoneum. Musculoskeletal: There are  no aggressive appearing lytic or blastic lesions noted in the visualized portions of the skeleton. IMPRESSION: 1. No acute findings in the abdomen or pelvis to account for the patient's symptoms. Specifically, no urinary tract calculi no findings to suggest urinary tract obstruction at this time. 2. Normal appendix. 3. Mild atherosclerosis. 4. Tiny umbilical hernia containing only omental fat incidentally noted. No evidence of associated bowel incarceration or obstruction at this time. Electronically Signed   By: Vinnie Langton M.D.   On: 11/14/2015 12:48     Assessment & Plan:  Plan    No orders of the defined types were placed in this encounter.   Problem List Items Addressed This Visit     HYPERCHOLESTEROLEMIA    Encourage heart healthy diet such as MIND or DASH diet, increase exercise, avoid trans fats, simple carbohydrates and processed foods, consider a krill or fish or flaxseed oil cap daily.        Relevant Orders   Lipid panel   GERD    Avoid offending foods, start probiotics. Do not eat large meals in late evening and consider raising head of bed. Doing well on Famotidine       Relevant Orders   CBC   Obese    Encouraged DASH or MIND diet, decrease po intake and increase exercise as tolerated. Needs 7-8 hours of sleep nightly. Avoid trans fats, eat small, frequent meals every 4-5 hours with lean proteins, complex carbs and healthy fats. Minimize simple carbs, high fat foods and processed foods       Preventative health care    Patient encouraged to maintain heart healthy diet, regular exercise, adequate sleep. Consider daily probiotics. Take medications as prescribed. Labs ordered and reviewed. Pneumovax 20 given today. Negative Cologuard in 2020, repeat in 2023 or proceed with colonoscopy       Depression with anxiety    Doing well on Escitalopram 40 mg daily, no changes.           Hyperglycemia    hgba1c acceptable, minimize simple carbs. Increase exercise as  tolerated.        Relevant Orders   Hemoglobin A1c   Comprehensive metabolic panel (Completed)   Hypothyroid    On Levothyroxine, continue to monitor  Relevant Orders   TSH   Other Visit Diagnoses     High risk medication use    -  Primary   Relevant Orders   DRUG MONITORING, PANEL 8 WITH CONFIRMATION, URINE   Nocturia       Relevant Orders   CBC   PSA (Completed)   Need for pneumococcal vaccination       Relevant Orders   Pneumococcal conjugate vaccine 20-valent (Completed)       Follow-up: Return in about 6 months (around 12/16/2021), or f/u.  I, Suezanne Jacquet, acting as a scribe for Penni Homans, MD, have documented all relevent documentation on behalf of Penni Homans, MD, as directed by Penni Homans, MD while in the presence of Penni Homans, MD.  I, Mosie Lukes, MD personally performed the services described in this documentation. All medical record entries made by the scribe were at my direction and in my presence. I have reviewed the chart and agree that the record reflects my personal performance and is accurate and complete

## 2021-06-15 NOTE — Assessment & Plan Note (Signed)
Encouraged DASH or MIND diet, decrease po intake and increase exercise as tolerated. Needs 7-8 hours of sleep nightly. Avoid trans fats, eat small, frequent meals every 4-5 hours with lean proteins, complex carbs and healthy fats. Minimize simple carbs, high fat foods and processed foods 

## 2021-06-15 NOTE — Assessment & Plan Note (Signed)
On Levothyroxine, continue to monitor 

## 2021-06-15 NOTE — Patient Instructions (Addendum)
Shingrix is the new shingles shot, 2 shots over 2-6 months, confirm coverage with insurance and document, then can return here for shots with nurse appt or at pharmacy   try DASH or MIND diet, decrease po intake and increase exercise as tolerated. Needs 7-8 hours of sleep nightly. Avoid trans fats, eat small, frequent meals every 4-5 hours with lean proteins, complex carbs and healthy fats. Minimize simple carbs, high fat foods and processed foods     Paxlovid is the new COVID medication we can give you if you get COVID so make sure you test if you have symptoms because we have to treat by day 5 of symptoms for it to be effective. If you are positive let us know so we can treat. If a home test is negative and your symptoms are persistent get a PCR test. Can check testing locations at [email or web address] If you are positive we will make an appointment with [place] and we will send in Paxlovid if you would like it. Check with your pharmacy before we meet to confirm they have it in stock, if they do not then we can get the prescription at the [place] 65 Years and Older, Male Preventive care refers to lifestyle choices and visits with your health care provider that can promote health and wellness. This includes: A yearly physical exam. This is also called an annual wellness visit. Regular dental and eye exams. Immunizations. Screening for certain conditions. Healthy lifestyle choices, such as: Eating a healthy diet. Getting regular exercise. Not using drugs or products that contain nicotine and tobacco. Limiting alcohol use. What can I expect for my preventive care visit? Physical exam Your health care provider will check your: Height and weight. These may be used to calculate your BMI (body mass index). BMI is a measurement that tells if you are at a healthy weight. Heart rate and blood pressure. Body temperature. Skin for abnormal spots. Counseling Your health  care provider may ask you questions about your: Past medical problems. Family's medical history. Alcohol, tobacco, and drug use. Emotional well-being. Home life and relationship well-being. Sexual activity. Diet, exercise, and sleep habits. History of falls. Memory and ability to understand (cognition). Work and work Statistician. Access to firearms. What immunizations do I need?  Vaccines are usually given at various ages, according to a schedule. Your health care provider will recommend vaccines for you based on your age, medicalhistory, and lifestyle or other factors, such as travel or where you work. What tests do I need? Blood tests Lipid and cholesterol levels. These may be checked every 5 years, or more often depending on your overall health. Hepatitis C test. Hepatitis B test. Screening Lung cancer screening. You may have this screening every year starting at age 78 if you have a 30-pack-year history of smoking and currently smoke or have quit within the past 15 years. Colorectal cancer screening. All adults should have this screening starting at age 25 and continuing until age 46. Your health care provider may recommend screening at age 16 if you are at increased risk. You will have tests every 1-10 years, depending on your results and the type of screening test. Prostate cancer screening. Recommendations will vary depending on your family history and other risks. Genital exam to check for testicular cancer or hernias. Diabetes screening. This is done by checking your blood sugar (glucose) after you have not eaten for a while (fasting). You may have this done every 1-3 years.  Abdominal aortic aneurysm (AAA) screening. You may need this if you are a current or former smoker. STD (sexually transmitted disease) testing, if you are at risk. Follow these instructions at home: Eating and drinking  Eat a diet that includes fresh fruits and vegetables, whole grains, lean protein,  and low-fat dairy products. Limit your intake of foods with high amounts of sugar, saturated fats, and salt. Take vitamin and mineral supplements as recommended by your health care provider. Do not drink alcohol if your health care provider tells you not to drink. If you drink alcohol: Limit how much you have to 0-2 drinks a day. Be aware of how much alcohol is in your drink. In the [place] one drink equals one 12 oz bottle of beer (355 mL), one 5 oz glass of wine (148 mL), or one 1 oz glass of hard liquor (44 mL).  Lifestyle Take daily care of your teeth and gums. Brush your teeth every morning and night with fluoride toothpaste. Floss one time each day. Stay active. Exercise for at least 30 minutes 5 or more days each week. Do not use any products that contain nicotine or tobacco, such as cigarettes, e-cigarettes, and chewing tobacco. If you need help quitting, ask your health care provider. Do not use drugs. If you are sexually active, practice safe sex. Use a condom or other form of protection to prevent STIs (sexually transmitted infections). Talk with your health care provider about taking a low-dose aspirin or statin. Find healthy ways to cope with stress, such as: Meditation, yoga, or listening to music. Journaling. Talking to a trusted person. Spending time with friends and family. Safety Always wear your seat belt while driving or riding in a vehicle. Do not drive: If you have been drinking alcohol. Do not ride with someone who has been drinking. When you are tired or distracted. While texting. Wear a helmet and other protective equipment during sports activities. If you have firearms in your house, make sure you follow all gun safety procedures. What's next? Visit your health care provider once a year for an annual wellness visit. Ask your health care provider how often you should have your eyes and teeth checked. Stay up to date on all vaccines. This information is not  intended to replace advice given to you by your health care provider. Make sure you discuss any questions you have with your healthcare provider. Document Revised: 08/25/2019 Document Reviewed: 11/20/2018 Elsevier Patient Education  2022 [employer].

## 2021-06-16 ENCOUNTER — Encounter: Payer: Self-pay | Admitting: Family Medicine

## 2021-06-18 NOTE — Assessment & Plan Note (Signed)
Patient encouraged to maintain heart healthy diet, regular exercise, adequate sleep. Consider daily probiotics. Take medications as prescribed. Labs ordered and reviewed. Pneumovax 20 given today. Negative Cologuard in 2020, repeat in 2023 or proceed with colonoscopy

## 2021-06-18 NOTE — Assessment & Plan Note (Signed)
Doing well on Escitalopram 40 mg daily, no changes.

## 2021-06-21 LAB — DRUG MONITORING, PANEL 8 WITH CONFIRMATION, URINE
6 Acetylmorphine: NEGATIVE ng/mL (ref <10)
Alcohol Metabolites: NEGATIVE ng/mL (ref <500)
Amphetamines: NEGATIVE ng/mL (ref <500)
Benzodiazepines: NEGATIVE ng/mL (ref <100)
Buprenorphine, Urine: NEGATIVE ng/mL (ref <5)
Cocaine Metabolite: NEGATIVE ng/mL (ref <150)
Creatinine: 51.9 mg/dL (ref >=20.0)
MDMA: NEGATIVE ng/mL (ref <500)
Marijuana Metabolite: 27 ng/mL — ABNORMAL HIGH (ref <5)
Marijuana Metabolite: POSITIVE ng/mL — AB (ref <20)
Opiates: NEGATIVE ng/mL (ref <100)
Oxidant: NEGATIVE ug/mL (ref <200)
Oxycodone: NEGATIVE ng/mL (ref <100)
pH: 6.3 (ref 4.5–9.0)

## 2021-06-21 LAB — DM TEMPLATE

## 2021-06-22 ENCOUNTER — Other Ambulatory Visit: Payer: Self-pay | Admitting: Family Medicine

## 2021-06-22 MED ORDER — ALPRAZOLAM 0.25 MG PO TABS: ORAL_TABLET | ORAL | 0 refills | Status: DC

## 2021-06-22 NOTE — Telephone Encounter (Signed)
Requesting: xanax Contract:06/21/2021 UDS:06/15/2021 Last Visit:06/15/2021 Next Visit:12/12/2021 Last Refill:02/10/2020  Please Advise

## 2021-08-10 ENCOUNTER — Ambulatory Visit: Payer: Medicare Other

## 2021-08-16 ENCOUNTER — Other Ambulatory Visit: Payer: Self-pay | Admitting: Family Medicine

## 2021-08-18 ENCOUNTER — Encounter: Payer: Self-pay | Admitting: Family Medicine

## 2021-08-18 NOTE — Telephone Encounter (Signed)
Patient's wife called regarding the mychart message she sent for her husband. Advised patient's wife to schedule appointment for the patient, but she stated the patient will not come in unless he sees Dr. Charlett Blake specifically. She was made aware that Charlett Blake was full for the week of 09/12 but [facility] had openings as well as other providers, she again stated that patient will not come in unless she saw Charlett Blake next week. Please advice.

## 2021-08-20 ENCOUNTER — Other Ambulatory Visit: Payer: Self-pay | Admitting: Family Medicine

## 2021-08-20 MED ORDER — VALACYCLOVIR HCL 1 G PO TABS: 1000.0000 mg | ORAL_TABLET | Freq: Three times a day (TID) | ORAL | 0 refills | Status: DC

## 2021-08-22 ENCOUNTER — Telehealth: Payer: Self-pay

## 2021-08-22 NOTE — Telephone Encounter (Signed)
Called to f/u with rash and see how things were going. Lvm to call back

## 2021-08-23 ENCOUNTER — Ambulatory Visit: Payer: Medicare Other

## 2021-09-06 ENCOUNTER — Ambulatory Visit: Payer: Medicare Other

## 2021-09-19 ENCOUNTER — Ambulatory Visit: Payer: Medicare Other

## 2021-09-25 ENCOUNTER — Ambulatory Visit: Payer: Medicare Other

## 2021-10-11 ENCOUNTER — Ambulatory Visit: Payer: Medicare Other

## 2021-10-13 ENCOUNTER — Ambulatory Visit: Payer: Medicare Other

## 2021-11-06 ENCOUNTER — Other Ambulatory Visit: Payer: Self-pay | Admitting: Family Medicine

## 2021-11-06 ENCOUNTER — Encounter: Payer: Self-pay | Admitting: Family Medicine

## 2021-11-07 ENCOUNTER — Other Ambulatory Visit: Payer: Self-pay

## 2021-11-07 MED ORDER — VALACYCLOVIR HCL 1 G PO TABS: 1000.0000 mg | ORAL_TABLET | Freq: Three times a day (TID) | ORAL | 0 refills | Status: DC

## 2021-11-07 MED ORDER — ACYCLOVIR 800 MG PO TABS
800.0000 mg | ORAL_TABLET | Freq: Three times a day (TID) | ORAL | 1 refills | Status: DC | PRN
Start: 2021-11-07 — End: 2023-12-30

## 2021-11-14 ENCOUNTER — Other Ambulatory Visit: Payer: Self-pay | Admitting: Family Medicine

## 2021-12-12 ENCOUNTER — Ambulatory Visit: Payer: Medicare Other | Admitting: Family Medicine

## 2021-12-22 ENCOUNTER — Other Ambulatory Visit: Payer: Self-pay | Admitting: Family Medicine

## 2022-01-21 ENCOUNTER — Other Ambulatory Visit: Payer: Self-pay | Admitting: Family Medicine

## 2022-01-22 MED ORDER — ALPRAZOLAM 0.25 MG PO TABS: ORAL_TABLET | ORAL | 0 refills | Status: DC

## 2022-01-22 NOTE — Telephone Encounter (Signed)
Requesting: alprazolam 0.25mg   Contract: 06/15/2021 UDS: 06/15/2021 Last Visit: 06/15/2021 Next Visit: 04/03/2022 Last Refill: 06/22/2021 #45 and 0RF  Please Advise

## 2022-04-02 NOTE — Progress Notes (Deleted)
Subjective:    Patient ID: Gerald Booth, male    DOB: 08-26-1956, 66 y.o.   MRN: [id number]  No chief complaint on file.   HPI Patient is in today for a six month follow up.  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   Hypothyroid 03/31/2017   Kidney stones 10/10/2012   Measles as a child   Mumps as a child   Obese 10/03/2012   Onychomycosis 10/10/2012   Preventative health care 10/10/2012   Skin cancer of face 04/06/2016    Past Surgical History:  Procedure Laterality Date   WISDOM TOOTH EXTRACTION  2011-03-09   1 tooth    Family History  Problem Relation Age of Onset   Cancer Mother        breast, bone   Hyperlipidemia Father    Other Father        prostate surgery   Dementia Father    Cancer Sister        metastatic lung cancer   AAA (abdominal aortic aneurysm) Brother    COPD Brother    Heart attack Maternal Grandmother    Kidney disease Maternal Grandfather        kidney failure    Social History   Socioeconomic History   Marital status: Married    Spouse name: Not on file   Number of children: 1   Years of education: Not on file   Highest education level: Not on file  Occupational History   Occupation: Retired  Tobacco Use   Smoking status: Former    Types: Cigarettes    Start date: 12/11/1991   Smokeless tobacco: Never   Tobacco comments:    smoked on weekends  Vaping Use   Vaping Use: Never used  Substance and Sexual Activity   Alcohol use: Yes    Comment: 1 a day   Drug use: No   Sexual activity: Yes    Comment: no dietary changes, lives with wife  Other Topics Concern   Not on file  Social History Narrative   Makes plates for a living.   Lives at home with wife         Brother  passed away 08-Mar-2016 from Aorta aneurism    Social Determinants of Health   Financial Resource Strain: Not on file  Food Insecurity: Not on file  Transportation Needs: Not on file  Physical Activity: Not on file  Stress: Not on file  Social Connections: Not on file  Intimate Partner Violence: Not on file    Outpatient Medications Prior to Visit  Medication Sig Dispense Refill   ALPRAZolam (XANAX) 0.25 MG tablet TAKE 1 TABLET BY MOUTH TWICE A DAY AS NEEDED FOR ANXIETY 45 tablet 0   acyclovir (ZOVIRAX) 800 MG tablet Take 1 tablet (800 mg total) by mouth 3 (three) times daily as needed. 270 tablet 1   aspirin EC 81 MG tablet Take 1 tablet (81 mg total) by mouth daily.     celecoxib (CELEBREX) 200 MG capsule TAKE ONE CAPSULE BY MOUTH TWICE A DAY AT BEDTIME 180 capsule 1   ciclopirox (CICLODAN) 8 % solution Apply topically at bedtime. Apply over nail and surrounding skin. Apply daily over previous coat. After seven (  7) days, may remove with alcohol and continue cycle. 6.6 mL 1   escitalopram (LEXAPRO) 5 MG tablet TAKE 2 TABLETS BY MOUTH EVERY DAY 180 tablet 1   famotidine (PEPCID) 40 MG tablet TAKE 1 TABLET (40 MG TOTAL) BY MOUTH EVERY EVENING. AS NEEDED 90 tablet 1   fexofenadine (ALLEGRA) 180 MG tablet Take 180 mg by mouth daily.     levothyroxine (SYNTHROID) 25 MCG tablet TAKE 1 TABLET BY MOUTH EVERY DAY 90 tablet 1   tadalafil (CIALIS) 5 MG tablet Take 1 tablet (5 mg total) by mouth daily as needed for erectile dysfunction. 40 tablet 5   valACYclovir (VALTREX) 1000 MG tablet Take 1 tablet (1,000 mg total) by mouth 3 (three) times daily. 21 tablet 0   No facility-administered medications prior to visit.    Allergies  Allergen Reactions   Penicillins     Nose bleeds    ROS     Objective:    Physical Exam  There were no vitals taken for this visit. Wt Readings from Last 3 Encounters:  06/15/21 263 lb 6.4 oz (119.5 kg)  12/12/20 253 lb (114.8 kg)  11/24/19 250 lb (113.4 kg)     Diabetic Foot Exam - Simple   No data filed    Lab Results  Component Value Date   WBC 6.4 06/15/2021   HGB 16.5 06/15/2021   HCT 47.5 06/15/2021   PLT 181.0 06/15/2021   GLUCOSE 105 (H) 06/15/2021   CHOL 262 (H) 06/15/2021   TRIG 306.0 (H) 06/15/2021   HDL 44.30 06/15/2021   LDLDIRECT 182.0 06/15/2021   LDLCALC 114 (H) 09/24/2017   ALT 37 06/15/2021   AST 27 06/15/2021   NA 138 06/15/2021   K 3.9 06/15/2021   CL 102 06/15/2021   CREATININE 1.15 06/15/2021   BUN 16 06/15/2021   CO2 25 06/15/2021   TSH 4.34 06/15/2021   PSA 0.92 06/15/2021   HGBA1C 5.4 06/15/2021    Lab Results  Component Value Date   TSH 4.34 06/15/2021   Lab Results  Component Value Date   WBC 6.4 06/15/2021   HGB 16.5 06/15/2021   HCT 47.5 06/15/2021   MCV 93.4 06/15/2021   PLT 181.0 06/15/2021   Lab Results  Component Value Date   NA 138 06/15/2021   K 3.9 06/15/2021   CO2 25 06/15/2021   GLUCOSE 105 (H) 06/15/2021   BUN 16 06/15/2021   CREATININE 1.15 06/15/2021   BILITOT 1.4 (H) 06/15/2021   ALKPHOS 77 06/15/2021   AST 27 06/15/2021   ALT 37 06/15/2021   PROT 7.5 06/15/2021   ALBUMIN 4.8 06/15/2021   CALCIUM 9.8 06/15/2021   GFR 66.02 06/15/2021   Lab Results  Component Value Date   CHOL 262 (H) 06/15/2021   Lab Results  Component Value Date   HDL 44.30 06/15/2021   Lab Results  Component Value Date   LDLCALC 114 (H) 09/24/2017   Lab Results  Component Value Date   TRIG 306.0 (H) 06/15/2021   Lab Results  Component Value Date   CHOLHDL 6 06/15/2021   Lab Results  Component Value Date   HGBA1C 5.4 06/15/2021       Assessment & Plan:   Problem List Items Addressed This Visit   None   I am having Gerald Booth maintain his aspirin EC, fexofenadine, tadalafil, celecoxib, ciclopirox, levothyroxine, valACYclovir, acyclovir, escitalopram, famotidine, and ALPRAZolam.  No orders of the defined types were placed in this encounter.

## 2022-04-03 ENCOUNTER — Ambulatory Visit: Payer: Medicare Other | Admitting: Family Medicine

## 2022-04-03 DIAGNOSIS — E039 Hypothyroidism, unspecified: Secondary | ICD-10-CM

## 2022-04-03 DIAGNOSIS — R739 Hyperglycemia, unspecified: Secondary | ICD-10-CM

## 2022-04-03 DIAGNOSIS — E7800 Pure hypercholesterolemia, unspecified: Secondary | ICD-10-CM

## 2022-04-03 DIAGNOSIS — Z79899 Other long term (current) drug therapy: Secondary | ICD-10-CM

## 2022-04-03 DIAGNOSIS — F418 Other specified anxiety disorders: Secondary | ICD-10-CM

## 2022-04-03 DIAGNOSIS — E669 Obesity, unspecified: Secondary | ICD-10-CM

## 2022-04-10 ENCOUNTER — Ambulatory Visit: Payer: Medicare Other | Admitting: Family Medicine

## 2022-05-16 ENCOUNTER — Other Ambulatory Visit: Payer: Self-pay | Admitting: Family Medicine

## 2022-06-14 ENCOUNTER — Encounter: Payer: Self-pay | Admitting: Family Medicine

## 2022-06-14 ENCOUNTER — Ambulatory Visit (INDEPENDENT_AMBULATORY_CARE_PROVIDER_SITE_OTHER): Payer: Medicare Other | Admitting: Family Medicine

## 2022-06-14 VITALS — BP 118/84 | HR 79 | Resp 20 | Ht 75.0 in | Wt 267.6 lb

## 2022-06-14 DIAGNOSIS — R030 Elevated blood-pressure reading, without diagnosis of hypertension: Secondary | ICD-10-CM

## 2022-06-14 DIAGNOSIS — E039 Hypothyroidism, unspecified: Secondary | ICD-10-CM | POA: Diagnosis not present

## 2022-06-14 DIAGNOSIS — E7800 Pure hypercholesterolemia, unspecified: Secondary | ICD-10-CM | POA: Diagnosis not present

## 2022-06-14 DIAGNOSIS — Z79899 Other long term (current) drug therapy: Secondary | ICD-10-CM | POA: Diagnosis not present

## 2022-06-14 DIAGNOSIS — E669 Obesity, unspecified: Secondary | ICD-10-CM

## 2022-06-14 DIAGNOSIS — F418 Other specified anxiety disorders: Secondary | ICD-10-CM

## 2022-06-14 DIAGNOSIS — R739 Hyperglycemia, unspecified: Secondary | ICD-10-CM

## 2022-06-14 NOTE — Progress Notes (Signed)
Subjective:    Patient ID: Gerald Booth, male    DOB: 1956/04/25, 66 y.o.   MRN: [id number]  Chief Complaint  Patient presents with   Follow-up    HPI Patient is in today for a follow up on chronic medical concerns. No recent febrile illness or acute hospitalizations. He is doing well on current meds and staying busy. He is trying to maintain a heart healthy diet and stay active. No complaints of polyuria or polydipsia. Denies CP/palp/SOB/HA/congestion/fevers/GI or GU c/o. 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   Hypothyroid 03/31/2017   Kidney stones 10/10/2012   Measles as a child   Mumps as a child   Obese 10/03/2012   Onychomycosis 10/10/2012   Preventative health care 10/10/2012   Skin cancer of face 04/06/2016    Past Surgical History:  Procedure Laterality Date   WISDOM TOOTH EXTRACTION  02/23/11   1 tooth    Family History  Problem Relation Age of Onset   Cancer Mother        breast, bone   Hyperlipidemia Father    Other Father        prostate surgery   Dementia Father    Cancer Sister        metastatic lung cancer   AAA (abdominal aortic aneurysm) Brother    COPD Brother    Heart attack Maternal Grandmother    Kidney disease Maternal Grandfather        kidney failure    Social History   Socioeconomic History   Marital status: Married    Spouse name: Not on file   Number of children: 1   Years of education: Not on file   Highest education level: Not on file  Occupational History   Occupation: Retired  Tobacco Use   Smoking status: Former    Types: Cigarettes    Start date: 12/11/1991   Smokeless tobacco: Never   Tobacco comments:    smoked on weekends  Vaping  Use   Vaping Use: Never used  Substance and Sexual Activity   Alcohol use: Yes    Comment: 1 a day   Drug use: No   Sexual activity: Yes    Comment: no dietary changes, lives with wife  Other Topics Concern   Not on file  Social History Narrative   Makes plates for a living.   Lives at home with wife         Brother passed away 2016/02/23 from Aorta aneurism    Social Determinants of Health   Financial Resource Strain: Not on file  Food Insecurity: Not on file  Transportation Needs: Not on file  Physical Activity: Not on file  Stress: Not on file  Social Connections: Not on file  Intimate Partner Violence: Not on file    Outpatient Medications Prior to Visit  Medication Sig Dispense Refill   acyclovir (ZOVIRAX) 800 MG tablet Take 1 tablet (800 mg total) by mouth 3 (three) times daily as needed. 270 tablet 1   ALPRAZolam (XANAX) 0.25 MG tablet TAKE 1 TABLET BY MOUTH TWICE A DAY AS NEEDED FOR ANXIETY 45 tablet 0   aspirin EC 81 MG tablet Take 1 tablet (  81 mg total) by mouth daily.     celecoxib (CELEBREX) 200 MG capsule TAKE ONE CAPSULE BY MOUTH TWICE A DAY AT BEDTIME 180 capsule 1   ciclopirox (CICLODAN) 8 % solution Apply topically at bedtime. Apply over nail and surrounding skin. Apply daily over previous coat. After seven (7) days, may remove with alcohol and continue cycle. 6.6 mL 1   escitalopram (LEXAPRO) 5 MG tablet TAKE 2 TABLETS BY MOUTH EVERY DAY 180 tablet 1   famotidine (PEPCID) 40 MG tablet TAKE 1 TABLET (40 MG TOTAL) BY MOUTH EVERY EVENING. AS NEEDED 90 tablet 1   fexofenadine (ALLEGRA) 180 MG tablet Take 180 mg by mouth daily.     levothyroxine (SYNTHROID) 25 MCG tablet TAKE 1 TABLET BY MOUTH EVERY DAY 90 tablet 1   tadalafil (CIALIS) 5 MG tablet Take 1 tablet (5 mg total) by mouth daily as needed for erectile dysfunction. 40 tablet 5   valACYclovir (VALTREX) 1000 MG tablet Take 1 tablet (1,000 mg total) by mouth 3 (three) times daily. 21 tablet 0   No  facility-administered medications prior to visit.    Allergies  Allergen Reactions   Penicillins     Nose bleeds    Review of Systems  Constitutional:  Negative for fever and malaise/fatigue.  HENT:  Negative for congestion.   Eyes:  Negative for blurred vision.  Respiratory:  Negative for shortness of breath.   Cardiovascular:  Negative for chest pain, palpitations and leg swelling.  Gastrointestinal:  Negative for abdominal pain, blood in stool and nausea.  Genitourinary:  Negative for dysuria and frequency.  Musculoskeletal:  Negative for falls.  Skin:  Negative for rash.  Neurological:  Negative for dizziness, loss of consciousness and headaches.  Endo/Heme/Allergies:  Negative for environmental allergies.  Psychiatric/Behavioral:  Negative for depression. The patient is not nervous/anxious.        Objective:    Physical Exam Constitutional:      General: He is not in acute distress.    Appearance: Normal appearance. He is not ill-appearing or toxic-appearing.  HENT:     Head: Normocephalic and atraumatic.     Right Ear: External ear normal.     Left Ear: External ear normal.     Nose: Nose normal.     Mouth/Throat:     Mouth: Mucous membranes are moist.  Eyes:     General:        Right eye: No discharge.        Left eye: No discharge.  Cardiovascular:     Heart sounds: Normal heart sounds. No murmur heard. Pulmonary:     Effort: Pulmonary effort is normal.     Breath sounds: Normal breath sounds.  Abdominal:     Palpations: Abdomen is soft.     Tenderness: There is no abdominal tenderness.  Musculoskeletal:     Right lower leg: No edema.     Left lower leg: No edema.  Skin:    Findings: No rash.  Neurological:     Mental Status: He is alert and oriented to person, place, and time.  Psychiatric:        Behavior: Behavior normal.     BP 118/84 (BP Location: Left Arm, Patient Position: Sitting, Cuff Size: Large)   Pulse 79   Resp 20   Ht '6\' 3"'$  (1.905  m)   Wt 267 lb 9.6 oz (121.4 kg)   SpO2 97%   BMI 33.45 kg/m  Wt Readings from Last 3 Encounters:  06/14/22 267 lb 9.6 oz (121.4 kg)  06/15/21 263 lb 6.4 oz (119.5 kg)  12/12/20 253 lb (114.8 kg)    Diabetic Foot Exam - Simple   No data filed    Lab Results  Component Value Date   WBC 6.4 06/15/2021   HGB 16.5 06/15/2021   HCT 47.5 06/15/2021   PLT 181.0 06/15/2021   GLUCOSE 105 (H) 06/15/2021   CHOL 262 (H) 06/15/2021   TRIG 306.0 (H) 06/15/2021   HDL 44.30 06/15/2021   LDLDIRECT 182.0 06/15/2021   LDLCALC 114 (H) 09/24/2017   ALT 37 06/15/2021   AST 27 06/15/2021   NA 138 06/15/2021   K 3.9 06/15/2021   CL 102 06/15/2021   CREATININE 1.15 06/15/2021   BUN 16 06/15/2021   CO2 25 06/15/2021   TSH 4.34 06/15/2021   PSA 0.92 06/15/2021   HGBA1C 5.4 06/15/2021    Lab Results  Component Value Date   TSH 4.34 06/15/2021   Lab Results  Component Value Date   WBC 6.4 06/15/2021   HGB 16.5 06/15/2021   HCT 47.5 06/15/2021   MCV 93.4 06/15/2021   PLT 181.0 06/15/2021   Lab Results  Component Value Date   NA 138 06/15/2021   K 3.9 06/15/2021   CO2 25 06/15/2021   GLUCOSE 105 (H) 06/15/2021   BUN 16 06/15/2021   CREATININE 1.15 06/15/2021   BILITOT 1.4 (H) 06/15/2021   ALKPHOS 77 06/15/2021   AST 27 06/15/2021   ALT 37 06/15/2021   PROT 7.5 06/15/2021   ALBUMIN 4.8 06/15/2021   CALCIUM 9.8 06/15/2021   GFR 66.02 06/15/2021   Lab Results  Component Value Date   CHOL 262 (H) 06/15/2021   Lab Results  Component Value Date   HDL 44.30 06/15/2021   Lab Results  Component Value Date   LDLCALC 114 (H) 09/24/2017   Lab Results  Component Value Date   TRIG 306.0 (H) 06/15/2021   Lab Results  Component Value Date   CHOLHDL 6 06/15/2021   Lab Results  Component Value Date   HGBA1C 5.4 06/15/2021       Assessment & Plan:      Problem List Items Addressed This Visit     HYPERCHOLESTEROLEMIA    Encourage heart healthy diet such as MIND or  DASH diet, increase exercise, avoid trans fats, simple carbohydrates and processed foods, consider a krill or fish or flaxseed oil cap daily.       Relevant Orders   Lipid panel   Elevated blood pressure, situational   Relevant Orders   Comprehensive metabolic panel   CBC   Obese    Encouraged DASH or MIND diet, decrease po intake and increase exercise as tolerated. Needs 7-8 hours of sleep nightly. Avoid trans fats, eat small, frequent meals every 4-5 hours with lean proteins, complex carbs and healthy fats. Minimize simple carbs, high fat foods and processed foods      Depression with anxiety    Continues to do well on Escitalopram and Alprazolam, no changes      Hyperglycemia    hgba1c acceptable, minimize simple carbs. Increase exercise as tolerated.       Relevant Orders   Hemoglobin A1c   Hypothyroid    On Levothyroxine, continue to monitor      Relevant Orders   TSH   Other Visit Diagnoses     High risk medication use    -  Primary   Relevant Orders   Drug Monitoring  Panel [id number] , Urine       I am having Sigmund Hazel. Pandit maintain his aspirin EC, fexofenadine, tadalafil, celecoxib, ciclopirox, levothyroxine, valACYclovir, acyclovir, famotidine, ALPRAZolam, and escitalopram.  No orders of the defined types were placed in this encounter.

## 2022-06-15 NOTE — Assessment & Plan Note (Signed)
Encouraged DASH or MIND diet, decrease po intake and increase exercise as tolerated. Needs 7-8 hours of sleep nightly. Avoid trans fats, eat small, frequent meals every 4-5 hours with lean proteins, complex carbs and healthy fats. Minimize simple carbs, high fat foods and processed foods 

## 2022-06-15 NOTE — Assessment & Plan Note (Signed)
Encourage heart healthy diet such as MIND or DASH diet, increase exercise, avoid trans fats, simple carbohydrates and processed foods, consider a krill or fish or flaxseed oil cap daily.  °

## 2022-06-15 NOTE — Assessment & Plan Note (Signed)
Continues to do well on Escitalopram and Alprazolam, no changes

## 2022-06-15 NOTE — Assessment & Plan Note (Signed)
hgba1c acceptable, minimize simple carbs. Increase exercise as tolerated.  

## 2022-06-15 NOTE — Assessment & Plan Note (Signed)
On Levothyroxine, continue to monitor 

## 2022-06-24 ENCOUNTER — Other Ambulatory Visit: Payer: Self-pay | Admitting: Family Medicine

## 2022-07-11 ENCOUNTER — Telehealth: Payer: Self-pay | Admitting: Family Medicine

## 2022-07-11 NOTE — Telephone Encounter (Signed)
Left message for patient to call back and schedule Medicare Annual Wellness Visit (AWV) in office.   If not able to come in office, please offer to do virtually or by telephone.  Left office number and my jabber #[phone].  AWVI eligible as of 04/09/2020   Please schedule at anytime with Nurse Health Advisor.  

## 2022-08-08 ENCOUNTER — Encounter (HOSPITAL_BASED_OUTPATIENT_CLINIC_OR_DEPARTMENT_OTHER): Payer: Self-pay

## 2022-08-08 ENCOUNTER — Telehealth: Payer: Self-pay

## 2022-08-08 ENCOUNTER — Emergency Department (HOSPITAL_BASED_OUTPATIENT_CLINIC_OR_DEPARTMENT_OTHER)
Admission: EM | Admit: 2022-08-08 | Discharge: 2022-08-08 | Disposition: A | Discharge status: Home / Self Care | Payer: Medicare Other | Attending: Emergency Medicine | Admitting: Emergency Medicine

## 2022-08-08 ENCOUNTER — Telehealth: Payer: Self-pay | Admitting: Family Medicine

## 2022-08-08 ENCOUNTER — Ambulatory Visit: Payer: Medicare Other | Admitting: Family

## 2022-08-08 ENCOUNTER — Other Ambulatory Visit: Payer: Self-pay

## 2022-08-08 DIAGNOSIS — R220 Localized swelling, mass and lump, head: Secondary | ICD-10-CM | POA: Diagnosis present

## 2022-08-08 DIAGNOSIS — K146 Glossodynia: Secondary | ICD-10-CM

## 2022-08-08 DIAGNOSIS — Z7982 Long term (current) use of aspirin: Secondary | ICD-10-CM | POA: Insufficient documentation

## 2022-08-08 MED ORDER — DEXAMETHASONE SODIUM PHOSPHATE 10 MG/ML IJ SOLN
10.0000 mg | Freq: Once | INTRAMUSCULAR | Status: AC
  Administered 2022-08-08: 10 mg via INTRAMUSCULAR
  Filled 2022-08-08: qty 1

## 2022-08-08 MED ORDER — ACETAMINOPHEN 500 MG PO TABS
1000.0000 mg | ORAL_TABLET | Freq: Once | ORAL | Status: AC
  Administered 2022-08-08: 1000 mg via ORAL
  Filled 2022-08-08: qty 2

## 2022-08-08 NOTE — Telephone Encounter (Signed)
Called pt Lvm to call office back to discuss  Treatment for the phone call message. Sent pt a Mychart message as well.

## 2022-08-08 NOTE — Telephone Encounter (Signed)
Called pt Lvm to call our office back and sent mychart message. I spoke to [facility] and she advised  Pt go to ER but not answer phone.

## 2022-08-08 NOTE — ED Provider Notes (Signed)
[facility] DEPT Provider Note   CSN: [id number] Arrival date & time: 08/08/22  1093     History  Chief Complaint  Patient presents with   sores on tongue    Gerald Booth is a 66 y.o. male.  Patient presents with 2 sores on the bottom of tongue and mild pain and swelling.  He has been eating significant citrus recently.  No breathing difficulties, no fevers or drainage.  Patient denies any blood pressure medication or ACE inhibitor.  Patient has tried Advil with no significant improvement.  No history of diabetes.       Home Medications Prior to Admission medications   Medication Sig Start Date End Date Taking? Authorizing Provider  acyclovir (ZOVIRAX) 800 MG tablet Take 1 tablet (800 mg total) by mouth 3 (three) times daily as needed. 11/07/21   Mosie Lukes, MD  ALPRAZolam Duanne Moron) 0.25 MG tablet TAKE 1 TABLET BY MOUTH TWICE A DAY AS NEEDED FOR ANXIETY 01/22/22   Mosie Lukes, MD  aspirin EC 81 MG tablet Take 1 tablet (81 mg total) by mouth daily. 10/04/17   Mosie Lukes, MD  celecoxib (CELEBREX) 200 MG capsule TAKE ONE CAPSULE BY MOUTH TWICE A DAY AT BEDTIME 04/20/19   Mosie Lukes, MD  ciclopirox (CICLODAN) 8 % solution Apply topically at bedtime. Apply over nail and surrounding skin. Apply daily over previous coat. After seven (7) days, may remove with alcohol and continue cycle. 04/20/19   Mosie Lukes, MD  escitalopram (LEXAPRO) 5 MG tablet TAKE 2 TABLETS BY MOUTH EVERY DAY 05/16/22   Mosie Lukes, MD  famotidine (PEPCID) 40 MG tablet TAKE 1 TABLET (40 MG TOTAL) BY MOUTH EVERY EVENING. AS NEEDED 06/25/22   Mosie Lukes, MD  fexofenadine (ALLEGRA) 180 MG tablet Take 180 mg by mouth daily.    [provider]  levothyroxine (SYNTHROID) 25 MCG tablet TAKE 1 TABLET BY MOUTH EVERY DAY 08/13/19   Mosie Lukes, MD  tadalafil (CIALIS) 5 MG tablet Take 1 tablet (5 mg total) by mouth daily as needed for erectile dysfunction.  04/20/19   Mosie Lukes, MD  valACYclovir (VALTREX) 1000 MG tablet Take 1 tablet (1,000 mg total) by mouth 3 (three) times daily. 11/07/21   Mosie Lukes, MD      Allergies    Penicillins    Review of Systems   Review of Systems  Constitutional:  Negative for chills and fever.  HENT:  Negative for congestion.   Eyes:  Negative for visual disturbance.  Respiratory:  Negative for shortness of breath.   Cardiovascular:  Negative for chest pain.  Gastrointestinal:  Negative for abdominal pain and vomiting.  Genitourinary:  Negative for dysuria and flank pain.  Musculoskeletal:  Negative for back pain, neck pain and neck stiffness.  Skin:  Negative for rash.  Neurological:  Negative for light-headedness and headaches.    Physical Exam Updated Vital Signs BP (!) 126/105 (BP Location: Right Arm)   Pulse 79   Temp 98.6 F (37 C) (Oral)   Resp 18   SpO2 98%  Physical Exam Vitals and nursing note reviewed.  Constitutional:      General: He is not in acute distress.    Appearance: He is well-developed.  HENT:     Head: Normocephalic and atraumatic.     Comments: Patient has focal swelling inferior aspect of tongue without drainage or induration, no stridor, no posterior pharyngeal edema.    Mouth/Throat:  Mouth: Mucous membranes are moist.  Eyes:     General:        Right eye: No discharge.        Left eye: No discharge.     Conjunctiva/sclera: Conjunctivae normal.  Neck:     Trachea: No tracheal deviation.  Cardiovascular:     Rate and Rhythm: Normal rate and regular rhythm.     Heart sounds: No murmur heard. Pulmonary:     Effort: Pulmonary effort is normal.     Breath sounds: Normal breath sounds.  Abdominal:     General: There is no distension.     Palpations: Abdomen is soft.     Tenderness: There is no abdominal tenderness. There is no guarding.  Musculoskeletal:     Cervical back: Normal range of motion and neck supple. No rigidity.  Lymphadenopathy:      Cervical: No cervical adenopathy.  Skin:    General: Skin is warm.     Capillary Refill: Capillary refill takes less than 2 seconds.     Findings: No rash.  Neurological:     General: No focal deficit present.     Mental Status: He is alert.     Cranial Nerves: No cranial nerve deficit.  Psychiatric:        Mood and Affect: Mood normal.     ED Results / Procedures / Treatments   Labs (all labs ordered are listed, but only abnormal results are displayed) Labs Reviewed - No data to display  EKG None  Radiology No results found.  Procedures Procedures    Medications Ordered in ED Medications  dexamethasone (DECADRON) injection 10 mg (has no administration in time range)  acetaminophen (TYLENOL) tablet 1,000 mg (has no administration in time range)    ED Course/ Medical Decision Making/ A&P                           Medical Decision Making Risk OTC drugs. Prescription drug management.   Patient presents with isolated tongue lesions that started after drinking/eating significant citrus.  Patient has no signs of angioedema at this time.  Discussed risk and benefits of Decadron and patient agrees to try.  Tylenol for pain.  Patient is nondiabetic.  Discussed reasons return no sign significant infection at this time.  Patient is a primary doctor.        Final Clinical Impression(s) / ED Diagnoses Final diagnoses:  Tongue pain    Rx / DC Orders ED Discharge Orders     None         Elnora Morrison, MD 08/08/22 1123

## 2022-08-08 NOTE — Telephone Encounter (Signed)
Pt's wife stated pt's tongue has been swollen since last night along with his throat. She gave him benadryl but it does not seem to be helping. Transferred to triage.

## 2022-08-08 NOTE — Discharge Instructions (Signed)
Return to the emergency room if you develop significant tongue swelling, breathing difficulty, fevers or new concerns.  Follow-up for recheck with your primary doctor in 2 to 3 days.

## 2022-08-08 NOTE — ED Triage Notes (Signed)
He c/o two "sores" on his tongue with some minimal tongue swelling. He tells me he has recently eaten much citrus. He is breathing normally and is in no distress.

## 2022-08-08 NOTE — Telephone Encounter (Signed)
Nurse Assessment Nurse: Kirk Ruths, RN, [facility] Date/Time Eilene Ghazi Time): 08/08/2022 8:21:03 AM Confirm and document reason for call. If symptomatic, describe symptoms. ---Caller states patient was c/o pain on his tongue last night this morning he has a swollen tongue and his jaw/chin area ia swollen as well no resp issue has not eaten any different foods no new meds gave him bendaryl and alieve no chest pain no changes in detergent etc Does the patient have any new or worsening symptoms? ---Yes Will a triage be completed? ---Yes Related visit to physician within the last 2 weeks? ---No Does the PT have any chronic conditions? (i.e. diabetes, asthma, this includes High risk factors for pregnancy, etc.) ---Yes List chronic conditions. ---anxiety acid reflux Is this a behavioral health or substance abuse call? ---No Guidelines Guideline Title Affirmed Question Affirmed Notes Nurse Date/Time Eilene Ghazi Time) Tongue Swelling Pain in tongue, mouth, or tooth Kirk Ruths, RN, [facility] 08/08/2022 8:25:57 AM PLEASE NOTE: All timestamps contained within this report are represented as [place] Standard Time. CONFIDENTIALTY NOTICE: This fax transmission is intended only for the addressee. It contains information that is legally privileged, confidential or otherwise protected from use or disclosure. If you are not the intended recipient, you are strictly prohibited from reviewing, disclosing, copying using or disseminating any of this information or taking any action in reliance on or regarding this information. If you have received this fax in error, please notify us immediately by telephone so that we can arrange for its return to [place]. Phone: [phone], Toll-Free: [phone], Fax: [phone] Page: 2 of 2 Call Id: [id number] [place]. Time Eilene Ghazi Time) Disposition Final User 08/08/2022 8:17:52 AM Send to Urgent Queue Kathlynn Grate 08/08/2022 8:27:59 AM Go to ED Now Yes Kirk Ruths, RN, [facility] Final  Disposition 08/08/2022 8:27:59 AM Go to ED Now Yes Kirk Ruths, RN, Liana Gerold Disagree/Comply Comply Caller Understands Yes PreDisposition Call Doctor Care Advice Given Per Guideline GO TO ED NOW: * You need to be seen in the Emergency Department. * Go to the ED at ___________ [place] now. Drive carefully. BRING MEDICINES: * Bring a list of your current medicines when you go to the Emergency Department (ER). * Bring the pill bottles too. This will help the doctor (or NP/PA) to make certain you are taking the right medicines and the right dose. * You become worse * Difficulty breathing occurs CALL EMS 911 IF: * Can't swallow normal secretions (e.g., drooling or spitting) CARE ADVICE given per Tongue Swelling (Adult) guideline. Referrals GO TO FACILITY OTHER - SPECIFY

## 2022-08-08 NOTE — Telephone Encounter (Signed)
Original message from patient's wife: Pt's wife stated pt's tongue has been swollen since last night along with his throat. She gave him benadryl but it does not seem to be helping. Transferred to triage.    Called pt regarding the phone message and was advised to go ER to get checked out per Debbrah Alar and the pt wife said they understand. Will be going to ER.

## 2022-11-19 ENCOUNTER — Other Ambulatory Visit: Payer: Self-pay | Admitting: Family Medicine

## 2022-12-12 ENCOUNTER — Other Ambulatory Visit: Payer: Self-pay | Admitting: Family Medicine

## 2022-12-27 ENCOUNTER — Other Ambulatory Visit: Payer: Self-pay | Admitting: Family Medicine

## 2023-01-11 ENCOUNTER — Other Ambulatory Visit: Payer: Self-pay | Admitting: Family Medicine

## 2023-01-14 MED ORDER — ALPRAZOLAM 0.25 MG PO TABS: ORAL_TABLET | ORAL | 1 refills | Status: DC

## 2023-01-14 NOTE — Telephone Encounter (Signed)
Requesting: alprazolam 0.'25mg'$   Contract: 04/02/22 UDS: Labs not completed on 06/15/22 Last Visit: 06/14/22 Next Visit: 02/19/23 Last Refill: 01/22/22 #45 and 0RF   Please Advise

## 2023-01-18 ENCOUNTER — Other Ambulatory Visit: Payer: Self-pay | Admitting: Family Medicine

## 2023-02-19 ENCOUNTER — Ambulatory Visit: Payer: Medicare Other | Admitting: Family Medicine

## 2023-03-01 NOTE — Progress Notes (Signed)
Subjective:   By signing my name below, I, Madelin Rear, attest that this documentation has been prepared under the direction and in the presence of Mosie Lukes, MD.  03/04/2023.   Patient ID: Gerald Booth, male    DOB: Jul 09, 1956, 67 y.o.   MRN: [id number]  No chief complaint on file.   HPI Patient is in today for an office visit.  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   Hypothyroid 03/31/2017   Kidney stones 10/10/2012   Measles as a child   Mumps as a child   Obese 10/03/2012   Onychomycosis 10/10/2012   Preventative health care 10/10/2012   Skin cancer of face 04/06/2016    Past Surgical History:  Procedure Laterality Date   WISDOM TOOTH EXTRACTION  April 02, 2011   1 tooth    Family History  Problem Relation Age of Onset   Cancer Mother        breast, bone   Hyperlipidemia Father    Other Father        prostate surgery   Dementia Father    Cancer Sister        metastatic lung cancer   AAA (abdominal aortic aneurysm) Brother    COPD Brother    Heart attack Maternal Grandmother    Kidney disease Maternal Grandfather        kidney failure    Social History   Socioeconomic History   Marital status: Married    Spouse name: Not on file   Number of children: 1   Years of education: Not on file   Highest education level: Not on file  Occupational History   Occupation: Retired  Tobacco Use   Smoking status: Former    Types: Cigarettes    Start date: 12/11/1991   Smokeless tobacco: Never   Tobacco comments:    smoked on weekends  Vaping Use   Vaping Use: Never used  Substance and Sexual Activity   Alcohol use: Yes    Comment: 1 a day   Drug use: No    Sexual activity: Yes    Comment: no dietary changes, lives with wife  Other Topics Concern   Not on file  Social History Narrative   Makes plates for a living.   Lives at home with wife         Brother passed away 2016/04/01 from Aorta aneurism    Social Determinants of Health   Financial Resource Strain: Not on file  Food Insecurity: Not on file  Transportation Needs: Not on file  Physical Activity: Not on file  Stress: Not on file  Social Connections: Not on file  Intimate Partner Violence: Not on file    Outpatient Medications Prior to Visit  Medication Sig Dispense Refill   acyclovir (ZOVIRAX) 800 MG tablet Take 1 tablet (800 mg total) by mouth 3 (three) times daily as needed. 270 tablet 1   ALPRAZolam (XANAX) 0.25 MG tablet TAKE 1 TABLET BY MOUTH TWICE A DAY AS NEEDED FOR ANXIETY 45 tablet 1   aspirin EC 81 MG tablet Take 1 tablet (81 mg total) by mouth daily.     celecoxib (CELEBREX) 200  MG capsule TAKE ONE CAPSULE BY MOUTH TWICE A DAY AT BEDTIME 180 capsule 1   ciclopirox (CICLODAN) 8 % solution Apply topically at bedtime. Apply over nail and surrounding skin. Apply daily over previous coat. After seven (7) days, may remove with alcohol and continue cycle. 6.6 mL 1   escitalopram (LEXAPRO) 5 MG tablet TAKE 2 TABLETS BY MOUTH EVERY DAY 180 tablet 1   famotidine (PEPCID) 40 MG tablet Take 1 tablet (40 mg total) by mouth every evening. AS NEEDED 90 tablet 1   fexofenadine (ALLEGRA) 180 MG tablet Take 180 mg by mouth daily.     levothyroxine (SYNTHROID) 25 MCG tablet TAKE 1 TABLET BY MOUTH EVERY DAY 90 tablet 1   tadalafil (CIALIS) 5 MG tablet Take 1 tablet (5 mg total) by mouth daily as needed for erectile dysfunction. 40 tablet 5   valACYclovir (VALTREX) 1000 MG tablet Take 1 tablet (1,000 mg total) by mouth 3 (three) times daily. 21 tablet 0   No facility-administered medications prior to visit.    Allergies  Allergen Reactions   Penicillins     Nose bleeds    ROS      Objective:    Physical Exam Constitutional:      General: He is not in acute distress.    Appearance: Normal appearance. He is not ill-appearing.  HENT:     Head: Normocephalic and atraumatic.     Right Ear: Tympanic membrane, ear canal and external ear normal.     Left Ear: Tympanic membrane, ear canal and external ear normal.  Eyes:     Extraocular Movements: Extraocular movements intact.     Pupils: Pupils are equal, round, and reactive to light.  Cardiovascular:     Rate and Rhythm: Normal rate and regular rhythm.     Heart sounds: Normal heart sounds. No murmur heard.    No gallop.  Pulmonary:     Effort: Pulmonary effort is normal. No respiratory distress.     Breath sounds: Normal breath sounds. No wheezing or rales.  Skin:    General: Skin is warm and dry.  Neurological:     General: No focal deficit present.     Mental Status: He is alert and oriented to person, place, and time.  Psychiatric:        Mood and Affect: Mood normal.        Behavior: Behavior normal.     There were no vitals taken for this visit. Wt Readings from Last 3 Encounters:  06/14/22 267 lb 9.6 oz (121.4 kg)  06/15/21 263 lb 6.4 oz (119.5 kg)  12/12/20 253 lb (114.8 kg)    Diabetic Foot Exam - Simple   No data filed    Lab Results  Component Value Date   WBC 6.4 06/15/2021   HGB 16.5 06/15/2021   HCT 47.5 06/15/2021   PLT 181.0 06/15/2021   GLUCOSE 105 (H) 06/15/2021   CHOL 262 (H) 06/15/2021   TRIG 306.0 (H) 06/15/2021   HDL 44.30 06/15/2021   LDLDIRECT 182.0 06/15/2021   LDLCALC 114 (H) 09/24/2017   ALT 37 06/15/2021   AST 27 06/15/2021   NA 138 06/15/2021   K 3.9 06/15/2021   CL 102 06/15/2021   CREATININE 1.15 06/15/2021   BUN 16 06/15/2021   CO2 25 06/15/2021   TSH 4.34 06/15/2021   PSA 0.92 06/15/2021   HGBA1C 5.4 06/15/2021    Lab Results  Component Value Date   TSH 4.34 06/15/2021   Lab  Results  Component Value Date   WBC 6.4 06/15/2021   HGB 16.5  06/15/2021   HCT 47.5 06/15/2021   MCV 93.4 06/15/2021   PLT 181.0 06/15/2021   Lab Results  Component Value Date   NA 138 06/15/2021   K 3.9 06/15/2021   CO2 25 06/15/2021   GLUCOSE 105 (H) 06/15/2021   BUN 16 06/15/2021   CREATININE 1.15 06/15/2021   BILITOT 1.4 (H) 06/15/2021   ALKPHOS 77 06/15/2021   AST 27 06/15/2021   ALT 37 06/15/2021   PROT 7.5 06/15/2021   ALBUMIN 4.8 06/15/2021   CALCIUM 9.8 06/15/2021   GFR 66.02 06/15/2021   Lab Results  Component Value Date   CHOL 262 (H) 06/15/2021   Lab Results  Component Value Date   HDL 44.30 06/15/2021   Lab Results  Component Value Date   LDLCALC 114 (H) 09/24/2017   Lab Results  Component Value Date   TRIG 306.0 (H) 06/15/2021   Lab Results  Component Value Date   CHOLHDL 6 06/15/2021   Lab Results  Component Value Date   HGBA1C 5.4 06/15/2021       Assessment & Plan:   Problem List Items Addressed This Visit   None    No orders of the defined types were placed in this encounter.   Alphonzo Grieve, personally preformed the services described in this documentation.  All medical record entries made by the scribe were at my direction and in my presence.  I have reviewed the chart and discharge instructions (if applicable) and agree that the record reflects my personal performance and is accurate and complete. 3/25//2024.  I,Mathew Stumpf,acting as a Education administrator for Penni Homans, MD.,have documented all relevant documentation on the behalf of Penni Homans, MD,as directed by  Penni Homans, MD while in the presence of Penni Homans, MD.   Madelin Rear

## 2023-03-03 NOTE — Assessment & Plan Note (Signed)
hgba1c acceptable, minimize simple carbs. Increase exercise as tolerated.  

## 2023-03-03 NOTE — Assessment & Plan Note (Signed)
On Lexapro and Alprazolam

## 2023-03-03 NOTE — Assessment & Plan Note (Signed)
Encourage heart healthy diet such as MIND or DASH diet, increase exercise, avoid trans fats, simple carbohydrates and processed foods, consider a krill or fish or flaxseed oil cap daily.  °

## 2023-03-03 NOTE — Assessment & Plan Note (Signed)
On Levothyroxine, continue to monitor 

## 2023-03-04 ENCOUNTER — Encounter: Payer: Self-pay | Admitting: Family Medicine

## 2023-03-04 ENCOUNTER — Ambulatory Visit (INDEPENDENT_AMBULATORY_CARE_PROVIDER_SITE_OTHER): Payer: Medicare Other | Admitting: Family Medicine

## 2023-03-04 VITALS — BP 128/80 | HR 78 | Temp 97.5°F | Resp 16 | Ht 75.0 in | Wt 265.6 lb

## 2023-03-04 DIAGNOSIS — E7800 Pure hypercholesterolemia, unspecified: Secondary | ICD-10-CM | POA: Diagnosis not present

## 2023-03-04 DIAGNOSIS — E039 Hypothyroidism, unspecified: Secondary | ICD-10-CM

## 2023-03-04 DIAGNOSIS — F418 Other specified anxiety disorders: Secondary | ICD-10-CM

## 2023-03-04 DIAGNOSIS — L509 Urticaria, unspecified: Secondary | ICD-10-CM | POA: Diagnosis not present

## 2023-03-04 DIAGNOSIS — E669 Obesity, unspecified: Secondary | ICD-10-CM | POA: Diagnosis not present

## 2023-03-04 DIAGNOSIS — R739 Hyperglycemia, unspecified: Secondary | ICD-10-CM

## 2023-03-04 NOTE — Assessment & Plan Note (Signed)
Is resolved now but was recurrent. Encouraged to take Cetirizine and Famotidine bid and one dose of Benadryl qhs. If no resolution consider referral to allergy for evaluation

## 2023-03-04 NOTE — Patient Instructions (Signed)
Cetirizine 10 mg twice a day and Famotidine 20 mg twice a day both over the counter if hives return and can take one dose of benadryl at bedtime.   Shingrix is the new shingles shot, 2 shots over 2-6 months, confirm coverage with insurance and document, then can return here for shots with nurse appt or at pharmacy   Tetanus booster at [place] (urticaria) are itchy, red, swollen areas of skin. They can show up on any part of the body. They often fade within 24 hours (acute hives). If you get new hives after the old ones fade and the cycle goes on for many days or weeks, it is called chronic hives. Hives do not spread from person to person (are not contagious). Hives can happen when your body reacts to something you are allergic to (allergen) or to something that irritates your skin. When you are exposed to something that triggers hives, your body releases a chemical called histamine. This causes redness, itching, and swelling. Hives can show up right after you are exposed to a trigger or hours later. What are the causes? Hives may be caused by: Food allergies. Insect bites or stings. Allergies to pollen or pets. Spending time in sunlight, heat, or cold (exposure). Exercise. Stress. You can also get hives from other conditions and treatments. These include: Viruses, such as the common cold. Bacterial infections, such as urinary tract infections and strep throat. Certain medicines. Contact with latex or chemicals. Allergy shots. Blood transfusions. In some cases, the cause of hives is not known (idiopathic hives). What increases the risk? You are more likely to get hives if: You are male. You have food allergies. Hives are more common if you are allergic to citrus fruits, milk, eggs, peanuts, tree nuts, or shellfish. You are allergic to: Medicines. Latex. Insects. Animals. Pollen. What are the signs or symptoms? Common symptoms of hives include raised, itchy, red or  white bumps or patches on your skin. These areas may: Become large and swollen (welts). Quickly change shape and location. This may happen more than once. Be separate hives or connect over a large area of skin. Sting or become painful. Turn white when pressed in the center (blanch). In severe cases, your hands, feet, and face may also become swollen. This may happen if hives form deeper in your skin. How is this diagnosed? Hives may be diagnosed based on your symptoms, medical history, and a physical exam. You may have skin, pee (urine), or blood tests done. These can help find out what is causing your hives and rule out other health issues. You may also have a biopsy done. This is when a small piece of skin is removed for testing. How is this treated? Treatment for hives depends on the cause and on how severe your symptoms are. You may be told to use cool, wet cloths (cool compresses) or to take cool showers to relieve itching. Treatment may also include: Medicines to help: Relieve itching (antihistamines). Reduce swelling (corticosteroids). Treat infection (antibiotics). An injectable medicine called omalizumab. You may need this if you have chronic idiopathic hives and still have symptoms even after you are treated with antihistamines. In severe cases, you may need to use a device filled with medicine that gives an emergency shot of epinephrine (auto-injector pen) to prevent a very bad allergic reaction (anaphylactic reaction). Follow these instructions at home: Medicines Take and apply over-the-counter and prescription medicines only as told by your health care provider. If you  were prescribed antibiotics, take them as told by your provider. Do not stop using the antibiotic even if you start to feel better. Skin care Apply cool compresses to the affected areas. Do not scratch or rub your skin. General instructions Do not take hot showers or baths. This can make itching worse. Do not  wear tight-fitting clothing. Use sunscreen. Wear protective clothing when you are outside. Avoid anything that causes your hives. Keep a journal to help track what causes your hives. Write down: What medicines you take. What you eat and drink. What products you use on your skin. Keep all follow-up visits. Your provider will track how well treatment is working. Contact a health care provider if: Your symptoms do not get better with medicine. Your joints are painful or swollen. You have a fever. You have pain in your abdomen. Get help right away if: Your tongue, lips, or eyelids swell. Your chest or throat feels tight. You have trouble breathing or swallowing. These symptoms may be an emergency. Use the auto-injector pen right away. Then call 911. Do not wait to see if the symptoms will go away. Do not drive yourself to the hospital. This information is not intended to replace advice given to you by your health care provider. Make sure you discuss any questions you have with your health care provider. Document Revised: 08/23/2022 Document Reviewed: 08/14/2022 Elsevier Patient Education  [employer].

## 2023-03-05 LAB — LDL CHOLESTEROL, DIRECT: Direct LDL: 175 mg/dL

## 2023-03-05 LAB — COMPREHENSIVE METABOLIC PANEL
ALT: 20 U/L (ref 0–53)
AST: 19 U/L (ref 0–37)
Albumin: 4.5 g/dL (ref 3.5–5.2)
Alkaline Phosphatase: 76 U/L (ref 39–117)
BUN: 13 mg/dL (ref 6–23)
CO2: 27 mEq/L (ref 19–32)
Calcium: 9.3 mg/dL (ref 8.4–10.5)
Chloride: 105 mEq/L (ref 96–112)
Creatinine, Ser: 1.19 mg/dL (ref 0.40–1.50)
GFR: 62.6 mL/min (ref >60.00)
Glucose, Bld: 100 mg/dL — ABNORMAL HIGH (ref 70–99)
Potassium: 4.3 mEq/L (ref 3.5–5.1)
Sodium: 140 mEq/L (ref 135–145)
Total Bilirubin: 0.7 mg/dL (ref 0.2–1.2)
Total Protein: 7.3 g/dL (ref 6.0–8.3)

## 2023-03-05 LAB — CBC WITH DIFFERENTIAL/PLATELET
Basophils Absolute: 0 10*3/uL (ref 0.0–0.1)
Basophils Relative: 0.8 % (ref 0.0–3.0)
Eosinophils Absolute: 0.1 10*3/uL (ref 0.0–0.7)
Eosinophils Relative: 1.2 % (ref 0.0–5.0)
HCT: 46.4 % (ref 39.0–52.0)
Hemoglobin: 16.3 g/dL (ref 13.0–17.0)
Lymphocytes Relative: 37.9 % (ref 12.0–46.0)
Lymphs Abs: 2.2 10*3/uL (ref 0.7–4.0)
MCHC: 35.1 g/dL (ref 30.0–36.0)
MCV: 93.6 fl (ref 78.0–100.0)
Monocytes Absolute: 0.5 10*3/uL (ref 0.1–1.0)
Monocytes Relative: 8.4 % (ref 3.0–12.0)
Neutro Abs: 3.1 10*3/uL (ref 1.4–7.7)
Neutrophils Relative %: 51.7 % (ref 43.0–77.0)
Platelets: 195 10*3/uL (ref 150.0–400.0)
RBC: 4.95 Mil/uL (ref 4.22–5.81)
RDW: 14.1 % (ref 11.5–15.5)
WBC: 5.9 10*3/uL (ref 4.0–10.5)

## 2023-03-05 LAB — LIPID PANEL
Cholesterol: 259 mg/dL — ABNORMAL HIGH (ref 0–200)
HDL: 47.7 mg/dL (ref >39.00)
NonHDL: 211.56
Total CHOL/HDL Ratio: 5
Triglycerides: 250 mg/dL — ABNORMAL HIGH (ref 0.0–149.0)
VLDL: 50 mg/dL — ABNORMAL HIGH (ref 0.0–40.0)

## 2023-03-05 LAB — TSH: TSH: 4.83 u[IU]/mL (ref 0.35–5.50)

## 2023-03-05 LAB — HEMOGLOBIN A1C: Hgb A1c MFr Bld: 5.5 % (ref 4.6–6.5)

## 2023-03-07 ENCOUNTER — Ambulatory Visit: Payer: Medicare Other | Admitting: Family Medicine

## 2023-03-21 ENCOUNTER — Telehealth: Payer: Self-pay | Admitting: Family Medicine

## 2023-03-21 NOTE — Telephone Encounter (Signed)
Contacted Gerald Booth to schedule their annual wellness visit. Patient declined to schedule AWV at this time. Spouse stated it was not a priority at this time and hung up  Verlee Rossetti; Care Guide Ambulatory Clinical Support [facility] Direct Dial: [phone]

## 2023-04-08 ENCOUNTER — Ambulatory Visit: Payer: Medicare Other | Admitting: Family Medicine

## 2023-06-16 ENCOUNTER — Other Ambulatory Visit: Payer: Self-pay | Admitting: Family Medicine

## 2023-08-04 ENCOUNTER — Other Ambulatory Visit: Payer: Self-pay | Admitting: Family Medicine

## 2023-08-10 ENCOUNTER — Encounter: Payer: Self-pay | Admitting: Family Medicine

## 2023-08-10 ENCOUNTER — Other Ambulatory Visit: Payer: Self-pay | Admitting: Family Medicine

## 2023-08-13 ENCOUNTER — Other Ambulatory Visit: Payer: Self-pay | Admitting: Family

## 2023-08-13 MED ORDER — ALPRAZOLAM 0.25 MG PO TABS: ORAL_TABLET | ORAL | 0 refills | Status: DC

## 2023-09-16 ENCOUNTER — Encounter: Payer: Medicare Other | Admitting: Family Medicine

## 2023-10-23 NOTE — Assessment & Plan Note (Signed)
Encourage heart healthy diet such as MIND or DASH diet, increase exercise, avoid trans fats, simple carbohydrates and processed foods, consider a krill or fish or flaxseed oil cap daily.  °

## 2023-10-23 NOTE — Assessment & Plan Note (Signed)
Encouraged DASH or MIND diet, decrease po intake and increase exercise as tolerated. Needs 7-8 hours of sleep nightly. Avoid trans fats, eat small, frequent meals every 4-5 hours with lean proteins, complex carbs and healthy fats. Minimize simple carbs, high fat foods and processed foods 

## 2023-10-23 NOTE — Assessment & Plan Note (Signed)
Doing well on current meds.

## 2023-10-23 NOTE — Assessment & Plan Note (Signed)
hgba1c acceptable, minimize simple carbs. Increase exercise as tolerated.  

## 2023-10-23 NOTE — Assessment & Plan Note (Signed)
On Levothyroxine, continue to monitor 

## 2023-10-24 ENCOUNTER — Ambulatory Visit (INDEPENDENT_AMBULATORY_CARE_PROVIDER_SITE_OTHER): Payer: Medicare Other | Admitting: Family Medicine

## 2023-10-24 ENCOUNTER — Encounter: Payer: Self-pay | Admitting: Family Medicine

## 2023-10-24 VITALS — BP 126/80 | HR 77 | Temp 97.9°F | Resp 18 | Ht 75.0 in | Wt 260.2 lb

## 2023-10-24 DIAGNOSIS — E669 Obesity, unspecified: Secondary | ICD-10-CM

## 2023-10-24 DIAGNOSIS — F418 Other specified anxiety disorders: Secondary | ICD-10-CM | POA: Diagnosis not present

## 2023-10-24 DIAGNOSIS — E7800 Pure hypercholesterolemia, unspecified: Secondary | ICD-10-CM | POA: Diagnosis not present

## 2023-10-24 DIAGNOSIS — R739 Hyperglycemia, unspecified: Secondary | ICD-10-CM

## 2023-10-24 DIAGNOSIS — Z23 Encounter for immunization: Secondary | ICD-10-CM

## 2023-10-24 DIAGNOSIS — E039 Hypothyroidism, unspecified: Secondary | ICD-10-CM

## 2023-10-24 MED ORDER — ALPRAZOLAM 0.5 MG PO TABS: 0.5000 mg | ORAL_TABLET | Freq: Three times a day (TID) | ORAL | 1 refills | Status: DC | PRN

## 2023-10-24 NOTE — Patient Instructions (Addendum)
Shingrix is the new shingles shot, 2 shots over 2-6 months, confirm coverage with insurance and document, then can return here for shots with nurse appt or at pharmacy   Tetanus is due  COVID booster   Panic Attack A panic attack is a sudden episode of severe anxiety, fear, or discomfort that causes physical and emotional symptoms. A panic attack may be in response to something frightening, or it may occur for no known reason. Symptoms of a panic attack can be similar to symptoms of a heart attack or stroke. It is important to see your health care provider when you have a panic attack so that these conditions can be ruled out. What are the causes? A panic attack may be caused by: An extreme, life-threatening situation, such as a war or natural disaster. An anxiety disorder, such as post-traumatic stress disorder. Depression. Panic disorder. Certain medical conditions, including heart problems, neurological conditions, and infections. Other causes may include: Certain over-the-counter and prescription medicines. Supplements that increase anxiety. Illegal drugs that increase heart rate and blood pressure, such as methamphetamine. What increases the risk? You are more likely to develop this condition if: You have another mental health condition. You use alcohol, illegal drugs, or other substances. You are under extreme stress. A life event is causing increased feelings of anxiety and depression. What are the signs or symptoms? A panic attack starts suddenly, usually lasts 5-10 minutes, and occurs with one or more of the following: A pounding heart, or a feeling that your heart is beating irregularly or faster than normal (palpitations). Sweating, trembling, or shaking. Shortness of breath, feeling smothered, or feeling choked. Chest pain or discomfort. Nausea or a strange feeling in your stomach. Dizziness, feeling light-headed, or feeling like you might faint. Other symptoms may  include: Chills or hot flashes. Numbness or tingling in your lips, hands, or feet. Feeling confused, or feeling that you are not yourself. Fear of losing control or of being emotionally unstable, or fear of dying. How is this diagnosed? A panic attack is diagnosed with an assessment by your health care provider. During the assessment, your health care provider will ask questions about: Your history of anxiety, depression, and panic attacks. Your medical history. Whether you drink alcohol, use drugs, take supplements, or take medicines. Be honest about your substance use. Your health care provider may also: Order blood tests or other kinds of tests to rule out serious medical conditions. Refer you to a mental health professional for further evaluation. How is this treated? A panic attack is a symptom of another condition. Treatment depends on the cause of the panic attack. If the cause is a medical problem, your health care provider will treat that problem or refer you to a specialist. If the cause is emotional, you may be given anti-anxiety medicines or referred to a counselor. Anti-anxiety medicines may reduce how often attacks happen, reduce how severe the attacks are, and lower anxiety. If the cause is a medicine, your health care provider may tell you to stop the medicine, change your dose, or take a different medicine. If the cause is an illegal drug, treatment may involve letting the drug wear off and taking medicine to help the drug leave your body or to stop its effects. Attacks caused by heavy drug use may continue even if you stop using the drug. Most panic attacks go away with treatment of the underlying problem. If you have panic attacks often, you may have a condition called panic disorder. Follow  these instructions at home: Alcohol use Do not drink alcohol if: Your health care provider tells you not to drink. You are pregnant, may be pregnant, or are planning to become  pregnant. If you drink alcohol: Limit how much you have to: 0-1 drink a day for women. 0-2 drinks a day for men. Know how much alcohol is in your drink. In the [place] one drink equals one 12 oz bottle of beer (355 mL), one 5 oz glass of wine (148 mL), or one 1 oz glass of hard liquor (44 mL). General instructions Take over-the-counter and prescription medicines only as told by your health care provider. If you feel anxious, limit your caffeine intake. Take good care of your physical and mental health by: Eating a balanced diet that includes plenty of fresh fruits and vegetables, whole grains, lean meats, and low-fat dairy. Getting plenty of rest. Try to get 7-8 hours of uninterrupted sleep each night. Exercising regularly. Try to get 30 minutes of physical activity at least 5 days a week. Do not use any products that contain nicotine or tobacco. These products include cigarettes, chewing tobacco, and vaping devices, such as e-cigarettes. If you need help quitting, ask your health care provider. Keep all follow-up visits. This is important. Panic attacks may have underlying physical or emotional problems that take time to accurately diagnose. Where to find more information Substance Abuse and Mental [facility]): [employer] of Mental Health [facility]): http://www.maynard.net/ Contact a health care provider if: Your symptoms do not improve, or they get worse. You are not able to take your medicine as prescribed because of side effects. Get help right away if: You have thoughts about hurting yourself or others. Get help right away if you feel like you may hurt yourself or others, or have thoughts about taking your own life. Go to your nearest emergency room or: Call 911. Call the National Suicide Prevention Lifeline at (704) 875-0782 or 988. This is open 24 hours a day. Text the Crisis Text Line at 289-140-3819. Summary A panic attack is a sudden episode of  severe anxiety, fear, or discomfort that causes physical and emotional symptoms. Always see a health care provider to have the reasons for the panic attack correctly diagnosed. If your panic attack was caused by a physical problem, follow your health care provider's suggestions for medicine, referral to a specialist, and lifestyle changes. If your panic attack was caused by an emotional problem, follow through with counseling from a qualified mental health specialist. If you feel like you may hurt yourself or others, call 911 and get help right away. This information is not intended to replace advice given to you by your health care provider. Make sure you discuss any questions you have with your health care provider. Document Revised: 07/06/2021 Document Reviewed: 07/06/2021 Elsevier Patient Education  2024 [employer].

## 2023-10-24 NOTE — Progress Notes (Signed)
Subjective:    Patient ID: Gerald Booth, male    DOB: Jan 31, 1956, 67 y.o.   MRN: [id number]  Chief Complaint  Patient presents with  . Medication Refill    HPI Discussed the use of AI scribe software for clinical note transcription with the patient, who gave verbal consent to proceed.  History of Present Illness   The patient, with a history of anxiety, panic attacks, high blood pressure, and allergies, presents with increased frequency of anxiety and panic attacks. He reports having had five "breakthroughs" with his anxiety in the past month and a half, which he describes as terrifying episodes that feel like being covered with a "metal jacket." The patient's current medication regimen includes Lexapro (escitalopram) and Xanax (alprazolam), but she reports that the Xanax has not been as effective as usual in alleviating his anxiety symptoms. He also mentions feeling tired during the day, which he attributes to his Lexapro dosage.  In addition to his mental health concerns, the patient reports having high blood pressure, which he monitors at home. His readings typically range from 135-145/80. He is currently taking Benicar (olmesartan) and amlodipine for his blood pressure. He also takes Allegra (fexofenadine) for his allergies, and he reports that his symptoms worsen if he does not take this medication.  The patient also discusses his daily habits and lifestyle. He reports drinking diluted vinegar every day and walking for exercise. He also mentions that he has been taking melatonin gummies to help him sleep.        Past Medical History:  Diagnosis Date  . Anxiety and depression 01/27/2013  . Chicken pox as a child  . Depression with anxiety 01/27/2013  . ED (erectile dysfunction) 10/10/2012  . Elevated BP 10/03/2012  . GERD (gastroesophageal reflux disease)   . Hearing loss 10/10/2012  . 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 Hypothyroid 03/31/2017  . Kidney stones 10/10/2012  .  Measles as a child  . Mumps as a child  . Obese 10/03/2012  . Onychomycosis 10/10/2012  . Preventative health care 10/10/2012  . Skin cancer of face 04/06/2016    Past Surgical History:  Procedure Laterality Date  . WISDOM TOOTH EXTRACTION  2012   1 tooth    Family History  Problem Relation Age of Onset  . Cancer Mother        breast, bone  . Hyperlipidemia Father   . Other Father        prostate surgery  . Dementia Father   . Cancer Sister        metastatic lung cancer  . AAA (abdominal aortic aneurysm) Brother   . COPD Brother   . Heart attack Maternal Grandmother   . Kidney disease Maternal Grandfather        kidney failure    Social History   Socioeconomic History  . Marital status: Married    Spouse name: Not on file  . Number of children: 1  . Years of education: Not on file  . Highest education level: 12th grade  Occupational History  . Occupation: Retired  Tobacco Use  . Smoking status: Former    Types: Cigarettes    Start date: 12/11/1991  . Smokeless tobacco: Never  . Tobacco comments:    smoked on weekends  Vaping Use  . Vaping status: Never Used  Substance and Sexual Activity  . Alcohol use: Yes    Comment: 1 a day  . Drug use: No  . Sexual activity: Yes  Comment: no dietary changes, lives with wife  Other Topics Concern  . Not on file  Social History Narrative   Makes plates for a living.   Lives at home with wife         Brother passed away 11/27/2016 from Aorta aneurism    Social Determinants of Health   Financial Resource Strain: Low Risk  (10/23/2023)   Overall Financial Resource Strain (CARDIA)   . Difficulty of Paying Living Expenses: Not hard at all  Food Insecurity: No Food Insecurity (10/23/2023)   Hunger Vital Sign   . Worried About Programme researcher, broadcasting/film/video in the Last Year: Never true   . Ran Out of Food in the Last Year: Never true  Transportation Needs: No Transportation Needs (10/23/2023)   PRAPARE - Transportation   . Lack of  Transportation (Medical): No   . Lack of Transportation (Non-Medical): No  Physical Activity: Sufficiently Active (10/23/2023)   Exercise Vital Sign   . Days of Exercise per Week: 5 days   . Minutes of Exercise per Session: 150+ min  Stress: Stress Concern Present (10/23/2023)   [employer] of Occupational Health - Occupational Stress Questionnaire   . Feeling of Stress : To some extent  Social Connections: Moderately Isolated (10/23/2023)   Social Connection and Isolation Panel [NHANES]   . Frequency of Communication with Friends and Family: More than three times a week   . Frequency of Social Gatherings with Friends and Family: More than three times a week   . Attends Religious Services: Never   . Active Member of Clubs or Organizations: No   . Attends Banker Meetings: Not on file   . Marital Status: Married  Catering manager Violence: Not on file    Outpatient Medications Prior to Visit  Medication Sig Dispense Refill  . acyclovir (ZOVIRAX) 800 MG tablet Take 1 tablet (800 mg total) by mouth 3 (three) times daily as needed. 270 tablet 1  . aspirin EC 81 MG tablet Take 1 tablet (81 mg total) by mouth daily.    . ciclopirox (CICLODAN) 8 % solution Apply topically at bedtime. Apply over nail and surrounding skin. Apply daily over previous coat. After seven (7) days, may remove with alcohol and continue cycle. 6.6 mL 1  . escitalopram (LEXAPRO) 5 MG tablet TAKE 2 TABLETS BY MOUTH EVERY DAY 180 tablet 1  . famotidine (PEPCID) 40 MG tablet TAKE 1 TABLET (40 MG TOTAL) BY MOUTH EVERY EVENING. AS NEEDED 90 tablet 1  . fexofenadine (ALLEGRA) 180 MG tablet Take 180 mg by mouth daily.    Marland Kitchen levothyroxine (SYNTHROID) 25 MCG tablet TAKE 1 TABLET BY MOUTH EVERY DAY 90 tablet 1  . tadalafil (CIALIS) 5 MG tablet Take 1 tablet (5 mg total) by mouth daily as needed for erectile dysfunction. 40 tablet 5  . valACYclovir (VALTREX) 1000 MG tablet Take 1 tablet (1,000 mg total) by mouth 3  (three) times daily. 21 tablet 0  . ALPRAZolam (XANAX) 0.25 MG tablet TAKE 1 TABLET BY MOUTH TWICE A DAY AS NEEDED FOR ANXIETY 180 tablet 0  . celecoxib (CELEBREX) 200 MG capsule TAKE ONE CAPSULE BY MOUTH TWICE A DAY AT BEDTIME (Patient not taking: Reported on 10/24/2023) 180 capsule 1   No facility-administered medications prior to visit.    Allergies  Allergen Reactions  . Penicillins     Nose bleeds    Review of Systems  Constitutional:  Positive for malaise/fatigue. Negative for fever.  HENT:  Negative for  congestion.   Eyes:  Negative for blurred vision.  Respiratory:  Positive for shortness of breath.   Cardiovascular:  Negative for chest pain, palpitations and leg swelling.  Gastrointestinal:  Negative for abdominal pain, blood in stool and nausea.  Genitourinary:  Negative for dysuria and frequency.  Musculoskeletal:  Negative for falls.  Skin:  Negative for rash.  Neurological:  Negative for dizziness, loss of consciousness and headaches.  Endo/Heme/Allergies:  Negative for environmental allergies.  Psychiatric/Behavioral:  Positive for depression. The patient is nervous/anxious.        Objective:    Physical Exam Vitals reviewed.  Constitutional:      Appearance: Normal appearance. He is not ill-appearing.  HENT:     Head: Normocephalic and atraumatic.     Nose: Nose normal.  Eyes:     Conjunctiva/sclera: Conjunctivae normal.  Cardiovascular:     Rate and Rhythm: Normal rate.     Pulses: Normal pulses.     Heart sounds: Normal heart sounds. No murmur heard. Pulmonary:     Effort: Pulmonary effort is normal.     Breath sounds: Normal breath sounds. No wheezing.  Abdominal:     Palpations: Abdomen is soft. There is no mass.     Tenderness: There is no abdominal tenderness.  Musculoskeletal:     Cervical back: Normal range of motion.     Right lower leg: No edema.     Left lower leg: No edema.  Skin:    General: Skin is warm and dry.  Neurological:      General: No focal deficit present.     Mental Status: He is alert and oriented to person, place, and time.  Psychiatric:        Mood and Affect: Mood normal.   BP 126/80 (BP Location: Left Arm, Patient Position: Sitting, Cuff Size: Large)   Pulse 77   Temp 97.9 F (36.6 C) (Oral)   Resp 18   Ht 6\' 3"  (1.905 m)   Wt 260 lb 3.2 oz (118 kg)   SpO2 94%   BMI 32.52 kg/m  Wt Readings from Last 3 Encounters:  10/24/23 260 lb 3.2 oz (118 kg)  03/04/23 265 lb 9.6 oz (120.5 kg)  06/14/22 267 lb 9.6 oz (121.4 kg)    Diabetic Foot Exam - Simple   No data filed    Lab Results  Component Value Date   WBC 5.9 03/04/2023   HGB 16.3 03/04/2023   HCT 46.4 03/04/2023   PLT 195.0 03/04/2023   GLUCOSE 100 (H) 03/04/2023   CHOL 259 (H) 03/04/2023   TRIG 250.0 (H) 03/04/2023   HDL 47.70 03/04/2023   LDLDIRECT 175.0 03/04/2023   LDLCALC 114 (H) 09/24/2017   ALT 20 03/04/2023   AST 19 03/04/2023   NA 140 03/04/2023   K 4.3 03/04/2023   CL 105 03/04/2023   CREATININE 1.19 03/04/2023   BUN 13 03/04/2023   CO2 27 03/04/2023   TSH 4.83 03/04/2023   PSA 0.92 06/15/2021   HGBA1C 5.5 03/04/2023    Lab Results  Component Value Date   TSH 4.83 03/04/2023   Lab Results  Component Value Date   WBC 5.9 03/04/2023   HGB 16.3 03/04/2023   HCT 46.4 03/04/2023   MCV 93.6 03/04/2023   PLT 195.0 03/04/2023   Lab Results  Component Value Date   NA 140 03/04/2023   K 4.3 03/04/2023   CO2 27 03/04/2023   GLUCOSE 100 (H) 03/04/2023   BUN 13 03/04/2023  CREATININE 1.19 03/04/2023   BILITOT 0.7 03/04/2023   ALKPHOS 76 03/04/2023   AST 19 03/04/2023   ALT 20 03/04/2023   PROT 7.3 03/04/2023   ALBUMIN 4.5 03/04/2023   CALCIUM 9.3 03/04/2023   GFR 62.60 03/04/2023   Lab Results  Component Value Date   CHOL 259 (H) 03/04/2023   Lab Results  Component Value Date   HDL 47.70 03/04/2023   Lab Results  Component Value Date   LDLCALC 114 (H) 09/24/2017   Lab Results  Component  Value Date   TRIG 250.0 (H) 03/04/2023   Lab Results  Component Value Date   CHOLHDL 5 03/04/2023   Lab Results  Component Value Date   HGBA1C 5.5 03/04/2023       Assessment & Plan:  Depression with anxiety Assessment & Plan: Doing well on current meds  Orders: -     CBC with Differential/Platelet  HYPERCHOLESTEROLEMIA Assessment & Plan: Encourage heart healthy diet such as MIND or DASH diet, increase exercise, avoid trans fats, simple carbohydrates and processed foods, consider a krill or fish or flaxseed oil cap daily.   Orders: -     Lipid panel -     Comprehensive metabolic panel -     CBC with Differential/Platelet  Hyperglycemia Assessment & Plan: hgba1c acceptable, minimize simple carbs. Increase exercise as tolerated.   Orders: -     CBC with Differential/Platelet -     Hemoglobin A1c  Hypothyroidism, unspecified type Assessment & Plan: On Levothyroxine, continue to monitor  Orders: -     CBC with Differential/Platelet -     TSH  Obesity, unspecified class, unspecified obesity type, unspecified whether serious comorbidity present Assessment & Plan: Encouraged DASH or MIND diet, decrease po intake and increase exercise as tolerated. Needs 7-8 hours of sleep nightly. Avoid trans fats, eat small, frequent meals every 4-5 hours with lean proteins, complex carbs and healthy fats. Minimize simple carbs, high fat foods and processed foods  Orders: -     CBC with Differential/Platelet  Need for influenza vaccination -     Flu Vaccine Trivalent High Dose (Fluad)  Other orders -     ALPRAZolam; Take 1 tablet (0.5 mg total) by mouth 3 (three) times daily as needed for anxiety.  Dispense: 180 tablet; Refill: 1    Assessment and Plan    Anxiety Increased frequency of breakthrough anxiety episodes requiring additional doses of Alprazolam. Discussed the role of Escitalopram in managing baseline anxiety and Alprazolam for acute episodes. Noted potential for  addiction with Alprazolam. Patient reported fatigue with increased Escitalopram dose. -Increase Alprazolam dose. -Consider counseling for behavioral modification techniques. -Schedule follow-up in 3 months to assess medication effectiveness.  Hypertension Patient reports home blood pressure readings of 140-145/80, slightly elevated. Currently on Olmesartan and Amlodipine. -Consider increasing Amlodipine to 5mg  twice daily.  Immunizations Patient due for several immunizations including COVID booster, Tetanus, Prevnar 20, and Shingles. -Administer flu shot today. -Recommend patient to receive additional immunizations at pharmacy.  Follow-up in 6 months for a check-up. Order blood work today to check cholesterol and sugar levels.         Danise Edge, MD

## 2023-10-25 LAB — CBC WITH DIFFERENTIAL/PLATELET
Basophils Absolute: 0 10*3/uL (ref 0.0–0.1)
Basophils Relative: 0.7 % (ref 0.0–3.0)
Eosinophils Absolute: 0.1 10*3/uL (ref 0.0–0.7)
Eosinophils Relative: 1.3 % (ref 0.0–5.0)
HCT: 47.3 % (ref 39.0–52.0)
Hemoglobin: 16.1 g/dL (ref 13.0–17.0)
Lymphocytes Relative: 36.7 % (ref 12.0–46.0)
Lymphs Abs: 2.1 10*3/uL (ref 0.7–4.0)
MCHC: 34 g/dL (ref 30.0–36.0)
MCV: 94.7 fL (ref 78.0–100.0)
Monocytes Absolute: 0.4 10*3/uL (ref 0.1–1.0)
Monocytes Relative: 7.8 % (ref 3.0–12.0)
Neutro Abs: 3 10*3/uL (ref 1.4–7.7)
Neutrophils Relative %: 53.5 % (ref 43.0–77.0)
Platelets: 188 10*3/uL (ref 150.0–400.0)
RBC: 5 Mil/uL (ref 4.22–5.81)
RDW: 13.3 % (ref 11.5–15.5)
WBC: 5.7 10*3/uL (ref 4.0–10.5)

## 2023-10-25 LAB — COMPREHENSIVE METABOLIC PANEL
ALT: 24 U/L (ref 0–53)
AST: 20 U/L (ref 0–37)
Albumin: 4.4 g/dL (ref 3.5–5.2)
Alkaline Phosphatase: 70 U/L (ref 39–117)
BUN: 12 mg/dL (ref 6–23)
CO2: 27 meq/L (ref 19–32)
Calcium: 9.2 mg/dL (ref 8.4–10.5)
Chloride: 106 meq/L (ref 96–112)
Creatinine, Ser: 1.31 mg/dL (ref 0.40–1.50)
GFR: 55.54 mL/min — ABNORMAL LOW (ref >60.00)
Glucose, Bld: 103 mg/dL — ABNORMAL HIGH (ref 70–99)
Potassium: 4.2 meq/L (ref 3.5–5.1)
Sodium: 141 meq/L (ref 135–145)
Total Bilirubin: 1.1 mg/dL (ref 0.2–1.2)
Total Protein: 7 g/dL (ref 6.0–8.3)

## 2023-10-25 LAB — LIPID PANEL
Cholesterol: 241 mg/dL — ABNORMAL HIGH (ref 0–200)
HDL: 41.4 mg/dL (ref >39.00)
LDL Cholesterol: 158 mg/dL — ABNORMAL HIGH (ref 0–99)
NonHDL: 199.24
Total CHOL/HDL Ratio: 6
Triglycerides: 206 mg/dL — ABNORMAL HIGH (ref 0.0–149.0)
VLDL: 41.2 mg/dL — ABNORMAL HIGH (ref 0.0–40.0)

## 2023-10-25 LAB — HEMOGLOBIN A1C: Hgb A1c MFr Bld: 5.5 % (ref 4.6–6.5)

## 2023-10-25 LAB — TSH: TSH: 3.08 u[IU]/mL (ref 0.35–5.50)

## 2023-10-27 MED ORDER — MELATONIN 5 MG PO CHEW: 5.0000 mg | CHEWABLE_TABLET | Freq: Every day | ORAL | Status: AC

## 2023-10-28 ENCOUNTER — Encounter: Payer: Self-pay | Admitting: Emergency Medicine

## 2023-10-28 ENCOUNTER — Other Ambulatory Visit: Payer: Self-pay | Admitting: Emergency Medicine

## 2023-11-21 ENCOUNTER — Ambulatory Visit: Payer: Medicare Other | Admitting: Family Medicine

## 2023-12-21 ENCOUNTER — Other Ambulatory Visit: Payer: Self-pay | Admitting: Family Medicine

## 2023-12-28 ENCOUNTER — Encounter: Payer: Self-pay | Admitting: Family Medicine

## 2023-12-29 ENCOUNTER — Other Ambulatory Visit: Payer: Self-pay | Admitting: Family Medicine

## 2023-12-29 MED ORDER — VALACYCLOVIR HCL 1 G PO TABS: 1000.0000 mg | ORAL_TABLET | Freq: Three times a day (TID) | ORAL | 1 refills | Status: DC

## 2023-12-30 ENCOUNTER — Other Ambulatory Visit: Payer: Self-pay | Admitting: Family Medicine

## 2023-12-30 ENCOUNTER — Other Ambulatory Visit: Payer: Self-pay | Admitting: Family

## 2023-12-30 MED ORDER — ACYCLOVIR 800 MG PO TABS: 800.0000 mg | ORAL_TABLET | Freq: Three times a day (TID) | ORAL | 1 refills | Status: DC | PRN

## 2024-01-20 ENCOUNTER — Other Ambulatory Visit (HOSPITAL_BASED_OUTPATIENT_CLINIC_OR_DEPARTMENT_OTHER): Payer: Self-pay

## 2024-01-20 MED ORDER — DOXYCYCLINE HYCLATE 100 MG PO TABS
100.0000 mg | ORAL_TABLET | Freq: Two times a day (BID) | ORAL | 0 refills | Status: DC
  Filled 2024-01-20: qty 20, 10d supply, fill #0

## 2024-02-02 NOTE — Assessment & Plan Note (Deleted)
 Stable on current meds

## 2024-02-02 NOTE — Assessment & Plan Note (Deleted)
 hgba1c acceptable, minimize simple carbs. Increase exercise as tolerated.

## 2024-02-02 NOTE — Assessment & Plan Note (Deleted)
 Encourage heart healthy diet such as MIND or DASH diet, increase exercise, avoid trans fats, simple carbohydrates and processed foods, consider a krill or fish or flaxseed oil cap daily.

## 2024-02-02 NOTE — Assessment & Plan Note (Deleted)
 Encouraged DASH or MIND diet, decrease po intake and increase exercise as tolerated. Needs 7-8 hours of sleep nightly. Avoid trans fats, eat small, frequent meals every 4-5 hours with lean proteins, complex carbs and healthy fats. Minimize simple carbs, high fat foods and processed foods

## 2024-02-02 NOTE — Assessment & Plan Note (Deleted)
 On Levothyroxine, continue to monitor

## 2024-02-06 ENCOUNTER — Ambulatory Visit: Payer: Medicare Other | Admitting: Family Medicine

## 2024-02-06 DIAGNOSIS — E039 Hypothyroidism, unspecified: Secondary | ICD-10-CM

## 2024-02-06 DIAGNOSIS — R739 Hyperglycemia, unspecified: Secondary | ICD-10-CM

## 2024-02-06 DIAGNOSIS — E669 Obesity, unspecified: Secondary | ICD-10-CM

## 2024-02-06 DIAGNOSIS — E7800 Pure hypercholesterolemia, unspecified: Secondary | ICD-10-CM

## 2024-02-06 DIAGNOSIS — F418 Other specified anxiety disorders: Secondary | ICD-10-CM

## 2024-02-09 ENCOUNTER — Other Ambulatory Visit: Payer: Self-pay | Admitting: Family Medicine

## 2024-03-10 NOTE — Assessment & Plan Note (Signed)
 hgba1c acceptable, minimize simple carbs. Increase exercise as tolerated.

## 2024-03-10 NOTE — Assessment & Plan Note (Signed)
 On Levothyroxine, continue to monitor

## 2024-03-10 NOTE — Assessment & Plan Note (Signed)
 Encourage heart healthy diet such as MIND or DASH diet, increase exercise, avoid trans fats, simple carbohydrates and processed foods, consider a krill or fish or flaxseed oil cap daily.

## 2024-03-10 NOTE — Assessment & Plan Note (Signed)
 Encouraged DASH or MIND diet, decrease po intake and increase exercise as tolerated. Needs 7-8 hours of sleep nightly. Avoid trans fats, eat small, frequent meals every 4-5 hours with lean proteins, complex carbs and healthy fats. Minimize simple carbs, high fat foods and processed foods

## 2024-03-12 ENCOUNTER — Ambulatory Visit (INDEPENDENT_AMBULATORY_CARE_PROVIDER_SITE_OTHER): Payer: Medicare Other | Admitting: Family Medicine

## 2024-03-12 VITALS — BP 128/76 | HR 83 | Temp 97.7°F | Resp 18 | Ht 75.0 in | Wt 261.3 lb

## 2024-03-12 DIAGNOSIS — F418 Other specified anxiety disorders: Secondary | ICD-10-CM

## 2024-03-12 DIAGNOSIS — M542 Cervicalgia: Secondary | ICD-10-CM | POA: Diagnosis not present

## 2024-03-12 DIAGNOSIS — E669 Obesity, unspecified: Secondary | ICD-10-CM | POA: Diagnosis not present

## 2024-03-12 DIAGNOSIS — R739 Hyperglycemia, unspecified: Secondary | ICD-10-CM | POA: Diagnosis not present

## 2024-03-12 DIAGNOSIS — Z79899 Other long term (current) drug therapy: Secondary | ICD-10-CM

## 2024-03-12 DIAGNOSIS — E039 Hypothyroidism, unspecified: Secondary | ICD-10-CM

## 2024-03-12 DIAGNOSIS — E7800 Pure hypercholesterolemia, unspecified: Secondary | ICD-10-CM | POA: Diagnosis not present

## 2024-03-12 MED ORDER — KRILL OIL 350 MG PO CAPS: 1.0000 | ORAL_CAPSULE | Freq: Every day | ORAL | Status: AC

## 2024-03-12 NOTE — Patient Instructions (Signed)

## 2024-03-13 ENCOUNTER — Encounter: Payer: Self-pay | Admitting: Family Medicine

## 2024-03-13 LAB — CBC WITH DIFFERENTIAL/PLATELET
Basophils Absolute: 0 10*3/uL (ref 0.0–0.1)
Basophils Relative: 0.5 % (ref 0.0–3.0)
Eosinophils Absolute: 0.1 10*3/uL (ref 0.0–0.7)
Eosinophils Relative: 1.4 % (ref 0.0–5.0)
HCT: 45.9 % (ref 39.0–52.0)
Hemoglobin: 15.7 g/dL (ref 13.0–17.0)
Lymphocytes Relative: 40.7 % (ref 12.0–46.0)
Lymphs Abs: 2.1 10*3/uL (ref 0.7–4.0)
MCHC: 34.3 g/dL (ref 30.0–36.0)
MCV: 94.6 fl (ref 78.0–100.0)
Monocytes Absolute: 0.4 10*3/uL (ref 0.1–1.0)
Monocytes Relative: 7.8 % (ref 3.0–12.0)
Neutro Abs: 2.5 10*3/uL (ref 1.4–7.7)
Neutrophils Relative %: 49.6 % (ref 43.0–77.0)
Platelets: 195 10*3/uL (ref 150.0–400.0)
RBC: 4.85 Mil/uL (ref 4.22–5.81)
RDW: 13.7 % (ref 11.5–15.5)
WBC: 5.1 10*3/uL (ref 4.0–10.5)

## 2024-03-13 LAB — LIPID PANEL
Cholesterol: 249 mg/dL — ABNORMAL HIGH (ref 0–200)
HDL: 49.6 mg/dL (ref >39.00)
LDL Cholesterol: 167 mg/dL — ABNORMAL HIGH (ref 0–99)
NonHDL: 199.19
Total CHOL/HDL Ratio: 5
Triglycerides: 163 mg/dL — ABNORMAL HIGH (ref 0.0–149.0)
VLDL: 32.6 mg/dL (ref 0.0–40.0)

## 2024-03-13 LAB — COMPREHENSIVE METABOLIC PANEL WITH GFR
ALT: 18 U/L (ref 0–53)
AST: 19 U/L (ref 0–37)
Albumin: 4.6 g/dL (ref 3.5–5.2)
Alkaline Phosphatase: 68 U/L (ref 39–117)
BUN: 16 mg/dL (ref 6–23)
CO2: 24 meq/L (ref 19–32)
Calcium: 9.4 mg/dL (ref 8.4–10.5)
Chloride: 105 meq/L (ref 96–112)
Creatinine, Ser: 1.17 mg/dL (ref 0.40–1.50)
GFR: 63.43 mL/min (ref >60.00)
Glucose, Bld: 98 mg/dL (ref 70–99)
Potassium: 4.2 meq/L (ref 3.5–5.1)
Sodium: 140 meq/L (ref 135–145)
Total Bilirubin: 0.9 mg/dL (ref 0.2–1.2)
Total Protein: 7.2 g/dL (ref 6.0–8.3)

## 2024-03-13 LAB — TSH: TSH: 3.33 u[IU]/mL (ref 0.35–5.50)

## 2024-03-13 LAB — HEMOGLOBIN A1C: Hgb A1c MFr Bld: 5.4 % (ref 4.6–6.5)

## 2024-03-13 NOTE — Progress Notes (Signed)
 Subjective:    Patient ID: Gerald Booth, male    DOB: 09/10/1956, 68 y.o.   MRN: [id number]  Chief Complaint  Patient presents with  . Follow-up    HPI Discussed the use of AI scribe software for clinical note transcription with the patient, who gave verbal consent to proceed.  History of Present Illness Gerald Booth is a 68 year old male who presents with neck pain and lymph node swelling following a dental extraction.  He underwent a dental extraction due to a tooth infection with significant bacterial presence. The procedure lasted about two hours. Post-extraction, he was prescribed doxycycline, which effectively treated the infection. However, he experienced an itchy sensation, particularly in the back and torso area, after starting doxycycline, suspecting a mild allergic reaction. Despite this, he continued the medication as it was effective against the infection.  Following the extraction, he developed neck pain and swelling, which he associates with lymph node activity. The pain, described as similar to a 'crick in the neck', is alleviated with ice, heat, and topical CBD lotion. It does not restrict movement and is less severe than before. He also notes occasional bumps in the oily areas behind his ears, attributing them to wearing glasses and working outside in the heat.  He has a history of anxiety, which is currently well-managed with alprazolam taken in the morning and evening. He has discontinued melatonin for sleep as it was no longer effective and reports sleeping well without it.  He has made lifestyle changes, including reducing alcohol consumption and stopping marijuana use. He works outside, is mindful of his nutrition, and takes krill oil daily for its fatty acids. He also takes Tylenol, Lexapro, acyclovir, and an acid reflux medication.    Past Medical History:  Diagnosis Date  . Anxiety and depression 01/27/2013  . Chicken pox as a child  . Depression  with anxiety 01/27/2013  . ED (erectile dysfunction) 10/10/2012  . Elevated BP 10/03/2012  . GERD (gastroesophageal reflux disease)   . Hearing loss 10/10/2012  . 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 Hypothyroid 03/31/2017  . Kidney stones 10/10/2012  . Measles as a child  . Mumps as a child  . Obese 10/03/2012  . Onychomycosis 10/10/2012  . Preventative health care 10/10/2012  . Skin cancer of face 04/06/2016    Past Surgical History:  Procedure Laterality Date  . WISDOM TOOTH EXTRACTION  2012   1 tooth    Family History  Problem Relation Age of Onset  . Cancer Mother        breast, bone  . Hyperlipidemia Father   . Other Father        prostate surgery  . Dementia Father   . Cancer Sister        metastatic lung cancer  . AAA (abdominal aortic aneurysm) Brother   . COPD Brother   . Heart attack Maternal Grandmother   . Kidney disease Maternal Grandfather        kidney failure    Social History   Socioeconomic History  . Marital status: Married    Spouse name: Not on file  . Number of children: 1  . Years of education: Not on file  . Highest education level: 12th grade  Occupational History  . Occupation: Retired  Tobacco Use  . Smoking status: Former    Types: Cigarettes    Start date: 12/11/1991  . Smokeless tobacco: Never  . Tobacco comments:    smoked on weekends  Vaping  Use  . Vaping status: Never Used  Substance and Sexual Activity  . Alcohol use: Yes    Comment: 1 a day  . Drug use: No  . Sexual activity: Yes    Comment: no dietary changes, lives with wife  Other Topics Concern  . Not on file  Social History Narrative   Makes plates for a living.   Lives at home with wife         Brother passed away April 06, 2016 from Aorta aneurism    Social Drivers of Health   Financial Resource Strain: Low Risk  (03/12/2024)   Overall Financial Resource Strain (CARDIA)   . Difficulty of Paying Living Expenses: Not hard at all  Food Insecurity: No Food Insecurity (03/12/2024)    Hunger Vital Sign   . Worried About Programme researcher, broadcasting/film/video in the Last Year: Never true   . Ran Out of Food in the Last Year: Never true  Transportation Needs: No Transportation Needs (03/12/2024)   PRAPARE - Transportation   . Lack of Transportation (Medical): No   . Lack of Transportation (Non-Medical): No  Physical Activity: Unknown (03/12/2024)   Exercise Vital Sign   . Days of Exercise per Week: 4 days   . Minutes of Exercise per Session: Patient declined  Stress: Stress Concern Present (03/12/2024)   [employer] of Occupational Health - Occupational Stress Questionnaire   . Feeling of Stress : To some extent  Social Connections: Unknown (03/12/2024)   Social Connection and Isolation Panel [NHANES]   . Frequency of Communication with Friends and Family: More than three times a week   . Frequency of Social Gatherings with Friends and Family: Once a week   . Attends Religious Services: Patient declined   . Active Member of Clubs or Organizations: No   . Attends Banker Meetings: Not on file   . Marital Status: Married  Catering manager Violence: Not on file    Outpatient Medications Prior to Visit  Medication Sig Dispense Refill  . acyclovir (ZOVIRAX) 800 MG tablet Take 1 tablet (800 mg total) by mouth 3 (three) times daily as needed. 30 tablet 1  . ALPRAZolam (XANAX) 0.5 MG tablet Take 1 tablet (0.5 mg total) by mouth 3 (three) times daily as needed for anxiety. 180 tablet 1  . aspirin EC 81 MG tablet Take 1 tablet (81 mg total) by mouth daily.    . ciclopirox (CICLODAN) 8 % solution Apply topically at bedtime. Apply over nail and surrounding skin. Apply daily over previous coat. After seven (7) days, may remove with alcohol and continue cycle. 6.6 mL 1  . escitalopram (LEXAPRO) 5 MG tablet TAKE 2 TABLETS BY MOUTH EVERY DAY 180 tablet 1  . famotidine (PEPCID) 40 MG tablet Take 1 tablet (40 mg total) by mouth every evening. AS NEEDED 90 tablet 1  . fexofenadine  (ALLEGRA) 180 MG tablet Take 180 mg by mouth daily.    Marland Kitchen levothyroxine (SYNTHROID) 25 MCG tablet TAKE 1 TABLET BY MOUTH EVERY DAY 90 tablet 1  . Melatonin 5 MG CHEW Chew 5 mg by mouth daily.    . tadalafil (CIALIS) 5 MG tablet Take 1 tablet (5 mg total) by mouth daily as needed for erectile dysfunction. 40 tablet 5  . doxycycline (VIBRA-TABS) 100 MG tablet Take 1 tablet (100 mg total) by mouth in the morning and 1 tablet by mouth in the evening until gone. 20 tablet 0   No facility-administered medications prior to visit.  Allergies  Allergen Reactions  . Penicillins     Nose bleeds    Review of Systems  Constitutional:  Negative for fever and malaise/fatigue.  HENT:  Negative for congestion.   Eyes:  Negative for blurred vision.  Respiratory:  Negative for shortness of breath.   Cardiovascular:  Negative for chest pain, palpitations and leg swelling.  Gastrointestinal:  Negative for abdominal pain, blood in stool and nausea.  Genitourinary:  Negative for dysuria and frequency.  Musculoskeletal:  Positive for neck pain. Negative for falls.  Skin:  Negative for rash.  Neurological:  Negative for dizziness, loss of consciousness and headaches.  Endo/Heme/Allergies:  Negative for environmental allergies.  Psychiatric/Behavioral:  Negative for depression. The patient is nervous/anxious.        Objective:    Physical Exam Vitals reviewed.  Constitutional:      Appearance: Normal appearance. He is not ill-appearing.  HENT:     Head: Normocephalic and atraumatic.     Nose: Nose normal.  Eyes:     Conjunctiva/sclera: Conjunctivae normal.  Cardiovascular:     Rate and Rhythm: Normal rate.     Pulses: Normal pulses.     Heart sounds: Normal heart sounds. No murmur heard. Pulmonary:     Effort: Pulmonary effort is normal.     Breath sounds: Normal breath sounds. No wheezing.  Abdominal:     Palpations: Abdomen is soft. There is no mass.     Tenderness: There is no abdominal  tenderness.  Musculoskeletal:     Cervical back: Normal range of motion.     Right lower leg: No edema.     Left lower leg: No edema.  Skin:    General: Skin is warm and dry.  Neurological:     General: No focal deficit present.     Mental Status: He is alert and oriented to person, place, and time.  Psychiatric:        Mood and Affect: Mood normal.    BP 128/76 (BP Location: Left Arm, Patient Position: Sitting, Cuff Size: Large)   Pulse 83   Temp 97.7 F (36.5 C) (Oral)   Resp 18   Ht 6\' 3"  (1.905 m)   Wt 261 lb 4.8 oz (118.5 kg)   SpO2 95%   BMI 32.66 kg/m  Wt Readings from Last 3 Encounters:  03/12/24 261 lb 4.8 oz (118.5 kg)  10/24/23 260 lb 3.2 oz (118 kg)  03/04/23 265 lb 9.6 oz (120.5 kg)    Diabetic Foot Exam - Simple   No data filed    Lab Results  Component Value Date   WBC 5.1 03/12/2024   HGB 15.7 03/12/2024   HCT 45.9 03/12/2024   PLT 195.0 03/12/2024   GLUCOSE 98 03/12/2024   CHOL 249 (H) 03/12/2024   TRIG 163.0 (H) 03/12/2024   HDL 49.60 03/12/2024   LDLDIRECT 175.0 03/04/2023   LDLCALC 167 (H) 03/12/2024   ALT 18 03/12/2024   AST 19 03/12/2024   NA 140 03/12/2024   K 4.2 03/12/2024   CL 105 03/12/2024   CREATININE 1.17 03/12/2024   BUN 16 03/12/2024   CO2 24 03/12/2024   TSH 3.33 03/12/2024   PSA 0.92 06/15/2021   HGBA1C 5.4 03/12/2024    Lab Results  Component Value Date   TSH 3.33 03/12/2024   Lab Results  Component Value Date   WBC 5.1 03/12/2024   HGB 15.7 03/12/2024   HCT 45.9 03/12/2024   MCV 94.6 03/12/2024   PLT  195.0 03/12/2024   Lab Results  Component Value Date   NA 140 03/12/2024   K 4.2 03/12/2024   CO2 24 03/12/2024   GLUCOSE 98 03/12/2024   BUN 16 03/12/2024   CREATININE 1.17 03/12/2024   BILITOT 0.9 03/12/2024   ALKPHOS 68 03/12/2024   AST 19 03/12/2024   ALT 18 03/12/2024   PROT 7.2 03/12/2024   ALBUMIN 4.6 03/12/2024   CALCIUM 9.4 03/12/2024   GFR 63.43 03/12/2024   Lab Results  Component Value  Date   CHOL 249 (H) 03/12/2024   Lab Results  Component Value Date   HDL 49.60 03/12/2024   Lab Results  Component Value Date   LDLCALC 167 (H) 03/12/2024   Lab Results  Component Value Date   TRIG 163.0 (H) 03/12/2024   Lab Results  Component Value Date   CHOLHDL 5 03/12/2024   Lab Results  Component Value Date   HGBA1C 5.4 03/12/2024       Assessment & Plan:  HYPERCHOLESTEROLEMIA Assessment & Plan: Encourage heart healthy diet such as MIND or DASH diet, increase exercise, avoid trans fats, simple carbohydrates and processed foods, consider a krill or fish or flaxseed oil cap daily.   Orders: -     Lipid panel -     Comprehensive metabolic panel with GFR  Hyperglycemia Assessment & Plan: hgba1c acceptable, minimize simple carbs. Increase exercise as tolerated.   Orders: -     Hemoglobin A1c  Hypothyroidism, unspecified type Assessment & Plan: On Levothyroxine, continue to monitor  Orders: -     TSH  Obesity, unspecified class, unspecified obesity type, unspecified whether serious comorbidity present Assessment & Plan: Encouraged DASH or MIND diet, decrease po intake and increase exercise as tolerated. Needs 7-8 hours of sleep nightly. Avoid trans fats, eat small, frequent meals every 4-5 hours with lean proteins, complex carbs and healthy fats. Minimize simple carbs, high fat foods and processed foods   High risk medication use -     Drug Monitoring Panel [phone] , Urine; Future  Depression with anxiety -     Drug Monitoring Panel [id number] , Urine; Future  Neck pain -     CBC with Differential/Platelet  Other orders -     [facility] Lan[facility]; Take 1 capsule by mouth daily.    Assessment and Plan Assessment & Plan Dental infection Post-extraction infection risk managed with doxycycline despite allergic reaction. Extraction prevented serious complications. - Use ice and heat for neck pain. - Consider topical CBD for neck pain. - Follow up with  dentist.  Anxiety Anxiety well-managed with medication. No current symptoms. Alcohol consumption minimal. - Continue escitalopram 10 mg daily. - Continue alprazolam 0.25 mg as needed. - Encourage alcohol moderation.  Medication management Adheres to medication guidelines to manage side effects and avoid risks. - Continue acyclovir with food. - Limit Tylenol to 3000 mg/day. - Use Advil as needed with food. - Avoid Celebrex.  Follow-up Overall well-being maintained with scheduled follow-ups. - Schedule virtual visit in June. - Maintain September follow-up. - Complete lab work.     Danise Edge, MD

## 2024-03-15 LAB — DRUG MONITORING PANEL 376104, URINE
Amphetamines: NEGATIVE ng/mL (ref <500)
Barbiturates: NEGATIVE ng/mL (ref <300)
Benzodiazepines: NEGATIVE ng/mL (ref <100)
Cocaine Metabolite: NEGATIVE ng/mL (ref <150)
Desmethyltramadol: NEGATIVE ng/mL (ref <100)
Opiates: NEGATIVE ng/mL (ref <100)
Oxycodone: NEGATIVE ng/mL (ref <100)
Tramadol: NEGATIVE ng/mL (ref <100)

## 2024-03-15 LAB — DM TEMPLATE

## 2024-04-28 ENCOUNTER — Encounter: Payer: Medicare Other | Admitting: Family Medicine

## 2024-06-16 ENCOUNTER — Telehealth: Admitting: Family Medicine

## 2024-06-16 ENCOUNTER — Telehealth: Admitting: Student

## 2024-06-18 ENCOUNTER — Telehealth: Admitting: Student

## 2024-06-29 ENCOUNTER — Ambulatory Visit: Admitting: Family Medicine

## 2024-06-30 ENCOUNTER — Other Ambulatory Visit: Payer: Self-pay | Admitting: Family Medicine

## 2024-07-03 DIAGNOSIS — H905 Unspecified sensorineural hearing loss: Secondary | ICD-10-CM | POA: Diagnosis not present

## 2024-08-03 ENCOUNTER — Other Ambulatory Visit: Payer: Self-pay | Admitting: Family Medicine

## 2024-08-04 NOTE — Telephone Encounter (Signed)
 Requesting: Xanax  0.5 MG Contract: 03/12/24 UDS: 03/12/24 Last Visit: 03/12/2024 Next Visit: 09/03/2024 Last Refill: 10/24/23  Please Advise

## 2024-08-08 ENCOUNTER — Other Ambulatory Visit: Payer: Self-pay | Admitting: Family Medicine

## 2024-08-30 NOTE — Assessment & Plan Note (Deleted)
 hgba1c acceptable, minimize simple carbs. Increase exercise as tolerated.

## 2024-08-30 NOTE — Assessment & Plan Note (Deleted)
 Encourage heart healthy diet such as MIND or DASH diet, increase exercise, avoid trans fats, simple carbohydrates and processed foods, consider a krill or fish or flaxseed oil cap daily.

## 2024-08-30 NOTE — Assessment & Plan Note (Deleted)
Patient encouraged to maintain heart healthy diet, regular exercise, adequate sleep. Consider daily probiotics. Take medications as prescribed. Labs ordered and reviewed. Pneumovax 20 given today. Negative Cologuard in 2020, repeat in 2023 or proceed with colonoscopy

## 2024-08-30 NOTE — Assessment & Plan Note (Deleted)
 Doing well on current meds.

## 2024-09-02 ENCOUNTER — Other Ambulatory Visit: Payer: Self-pay | Admitting: Family

## 2024-09-03 ENCOUNTER — Encounter: Payer: Medicare Other | Admitting: Family Medicine

## 2024-09-03 DIAGNOSIS — F418 Other specified anxiety disorders: Secondary | ICD-10-CM

## 2024-09-03 DIAGNOSIS — R739 Hyperglycemia, unspecified: Secondary | ICD-10-CM

## 2024-09-03 DIAGNOSIS — E7800 Pure hypercholesterolemia, unspecified: Secondary | ICD-10-CM

## 2024-09-03 DIAGNOSIS — Z0000 Encounter for general adult medical examination without abnormal findings: Secondary | ICD-10-CM

## 2024-09-07 ENCOUNTER — Encounter: Payer: Self-pay | Admitting: Family Medicine

## 2024-09-07 MED ORDER — ACYCLOVIR 800 MG PO TABS: 800.0000 mg | ORAL_TABLET | Freq: Three times a day (TID) | ORAL | 1 refills | Status: DC | PRN

## 2024-09-30 ENCOUNTER — Other Ambulatory Visit: Payer: Self-pay | Admitting: Family Medicine

## 2024-11-11 ENCOUNTER — Other Ambulatory Visit: Payer: Self-pay | Admitting: Family Medicine

## 2024-12-29 ENCOUNTER — Encounter: Payer: Self-pay | Admitting: Family Medicine

## 2025-01-08 ENCOUNTER — Other Ambulatory Visit: Payer: Self-pay | Admitting: Family Medicine

## 2025-01-11 ENCOUNTER — Ambulatory Visit: Admitting: Family Medicine

## 2025-02-03 ENCOUNTER — Ambulatory Visit: Admitting: Student

## 2025-02-08 ENCOUNTER — Ambulatory Visit: Admitting: Family Medicine

## 2025-03-18 ENCOUNTER — Encounter: Admitting: Family Medicine
